# Patient Record
Sex: Female | Born: 1965 | Race: White | Hispanic: No | Smoking: Former smoker
Health system: Southern US, Community
[De-identification: ages and names within clinical notes are randomized; demographics above are authoritative.]

## PROBLEM LIST (undated history)

## (undated) ENCOUNTER — Emergency Department: Disposition: A | Discharge status: Home / Self Care | Payer: No Typology Code available for payment source

## (undated) DIAGNOSIS — F988 Other specified behavioral and emotional disorders with onset usually occurring in childhood and adolescence: Secondary | ICD-10-CM

## (undated) DIAGNOSIS — N189 Chronic kidney disease, unspecified: Secondary | ICD-10-CM

## (undated) DIAGNOSIS — E7800 Pure hypercholesterolemia, unspecified: Secondary | ICD-10-CM

## (undated) DIAGNOSIS — E785 Hyperlipidemia, unspecified: Secondary | ICD-10-CM

## (undated) DIAGNOSIS — O24419 Gestational diabetes mellitus in pregnancy, unspecified control: Secondary | ICD-10-CM

## (undated) DIAGNOSIS — G43909 Migraine, unspecified, not intractable, without status migrainosus: Secondary | ICD-10-CM

## (undated) DIAGNOSIS — E1140 Type 2 diabetes mellitus with diabetic neuropathy, unspecified: Secondary | ICD-10-CM

## (undated) HISTORY — DX: Type 2 diabetes mellitus with diabetic neuropathy, unspecified: E11.40

## (undated) HISTORY — DX: Chronic kidney disease, unspecified: N18.9

## (undated) HISTORY — DX: Migraine, unspecified, not intractable, without status migrainosus: G43.909

## (undated) HISTORY — DX: Other specified behavioral and emotional disorders with onset usually occurring in childhood and adolescence: F98.8

## (undated) HISTORY — DX: Hyperlipidemia, unspecified: E78.5

## (undated) HISTORY — DX: Gestational diabetes mellitus in pregnancy, unspecified control: O24.419

## (undated) HISTORY — DX: Pure hypercholesterolemia, unspecified: E78.00

---

## 1998-03-28 ENCOUNTER — Encounter (HOSPITAL_COMMUNITY): Admission: RE | Admit: 1998-03-28 | Discharge: 1998-06-26 | Payer: Self-pay | Admitting: Dermatology

## 1999-02-25 ENCOUNTER — Emergency Department (HOSPITAL_COMMUNITY): Admission: EM | Admit: 1999-02-25 | Discharge: 1999-02-25 | Payer: Self-pay | Admitting: Emergency Medicine

## 2004-06-12 ENCOUNTER — Emergency Department (HOSPITAL_COMMUNITY): Admission: EM | Admit: 2004-06-12 | Discharge: 2004-06-13 | Payer: Self-pay | Admitting: Emergency Medicine

## 2009-02-22 ENCOUNTER — Emergency Department: Payer: Self-pay | Admitting: Internal Medicine

## 2012-05-17 ENCOUNTER — Encounter: Payer: Self-pay | Admitting: *Deleted

## 2012-05-17 NOTE — Telephone Encounter (Signed)
error 

## 2014-11-05 ENCOUNTER — Other Ambulatory Visit: Payer: Self-pay | Admitting: Family Medicine

## 2014-11-05 ENCOUNTER — Ambulatory Visit
Admission: RE | Admit: 2014-11-05 | Discharge: 2014-11-05 | Disposition: A | Discharge status: Home / Self Care | Payer: No Typology Code available for payment source | Source: Ambulatory Visit | Attending: Family Medicine | Admitting: Family Medicine

## 2014-11-05 DIAGNOSIS — M25512 Pain in left shoulder: Secondary | ICD-10-CM

## 2016-03-11 DIAGNOSIS — J029 Acute pharyngitis, unspecified: Secondary | ICD-10-CM | POA: Diagnosis not present

## 2016-03-11 DIAGNOSIS — J069 Acute upper respiratory infection, unspecified: Secondary | ICD-10-CM | POA: Diagnosis not present

## 2016-03-15 DIAGNOSIS — N189 Chronic kidney disease, unspecified: Secondary | ICD-10-CM | POA: Diagnosis not present

## 2016-03-15 DIAGNOSIS — E1121 Type 2 diabetes mellitus with diabetic nephropathy: Secondary | ICD-10-CM | POA: Diagnosis not present

## 2016-03-15 DIAGNOSIS — F900 Attention-deficit hyperactivity disorder, predominantly inattentive type: Secondary | ICD-10-CM | POA: Diagnosis not present

## 2016-03-15 DIAGNOSIS — Z79899 Other long term (current) drug therapy: Secondary | ICD-10-CM | POA: Diagnosis not present

## 2016-03-17 DIAGNOSIS — F3342 Major depressive disorder, recurrent, in full remission: Secondary | ICD-10-CM | POA: Diagnosis not present

## 2016-03-17 DIAGNOSIS — F900 Attention-deficit hyperactivity disorder, predominantly inattentive type: Secondary | ICD-10-CM | POA: Diagnosis not present

## 2016-03-17 DIAGNOSIS — F410 Panic disorder [episodic paroxysmal anxiety] without agoraphobia: Secondary | ICD-10-CM | POA: Diagnosis not present

## 2016-06-04 DIAGNOSIS — L959 Vasculitis limited to the skin, unspecified: Secondary | ICD-10-CM | POA: Diagnosis not present

## 2016-06-04 DIAGNOSIS — E1165 Type 2 diabetes mellitus with hyperglycemia: Secondary | ICD-10-CM | POA: Diagnosis not present

## 2016-06-04 DIAGNOSIS — F900 Attention-deficit hyperactivity disorder, predominantly inattentive type: Secondary | ICD-10-CM | POA: Diagnosis not present

## 2016-06-04 DIAGNOSIS — Z794 Long term (current) use of insulin: Secondary | ICD-10-CM | POA: Diagnosis not present

## 2016-08-03 DIAGNOSIS — D485 Neoplasm of uncertain behavior of skin: Secondary | ICD-10-CM | POA: Diagnosis not present

## 2016-08-03 DIAGNOSIS — L820 Inflamed seborrheic keratosis: Secondary | ICD-10-CM | POA: Diagnosis not present

## 2016-09-08 DIAGNOSIS — F331 Major depressive disorder, recurrent, moderate: Secondary | ICD-10-CM | POA: Diagnosis not present

## 2016-09-08 DIAGNOSIS — F411 Generalized anxiety disorder: Secondary | ICD-10-CM | POA: Diagnosis not present

## 2016-09-14 DIAGNOSIS — E1121 Type 2 diabetes mellitus with diabetic nephropathy: Secondary | ICD-10-CM | POA: Diagnosis not present

## 2016-09-14 DIAGNOSIS — R21 Rash and other nonspecific skin eruption: Secondary | ICD-10-CM | POA: Diagnosis not present

## 2016-09-14 DIAGNOSIS — Z794 Long term (current) use of insulin: Secondary | ICD-10-CM | POA: Diagnosis not present

## 2016-09-14 DIAGNOSIS — Z79899 Other long term (current) drug therapy: Secondary | ICD-10-CM | POA: Diagnosis not present

## 2016-10-04 DIAGNOSIS — Z6828 Body mass index (BMI) 28.0-28.9, adult: Secondary | ICD-10-CM | POA: Diagnosis not present

## 2016-10-04 DIAGNOSIS — Z01419 Encounter for gynecological examination (general) (routine) without abnormal findings: Secondary | ICD-10-CM | POA: Diagnosis not present

## 2016-10-04 DIAGNOSIS — Z1231 Encounter for screening mammogram for malignant neoplasm of breast: Secondary | ICD-10-CM | POA: Diagnosis not present

## 2016-10-18 DIAGNOSIS — L308 Other specified dermatitis: Secondary | ICD-10-CM | POA: Diagnosis not present

## 2016-10-19 DIAGNOSIS — N870 Mild cervical dysplasia: Secondary | ICD-10-CM | POA: Diagnosis not present

## 2016-10-19 DIAGNOSIS — R87610 Atypical squamous cells of undetermined significance on cytologic smear of cervix (ASC-US): Secondary | ICD-10-CM | POA: Diagnosis not present

## 2016-11-25 DIAGNOSIS — F331 Major depressive disorder, recurrent, moderate: Secondary | ICD-10-CM | POA: Diagnosis not present

## 2016-12-06 DIAGNOSIS — E119 Type 2 diabetes mellitus without complications: Secondary | ICD-10-CM | POA: Diagnosis not present

## 2016-12-06 DIAGNOSIS — H5213 Myopia, bilateral: Secondary | ICD-10-CM | POA: Diagnosis not present

## 2016-12-06 DIAGNOSIS — Z0100 Encounter for examination of eyes and vision without abnormal findings: Secondary | ICD-10-CM | POA: Diagnosis not present

## 2016-12-09 ENCOUNTER — Other Ambulatory Visit (INDEPENDENT_AMBULATORY_CARE_PROVIDER_SITE_OTHER): Payer: Self-pay | Admitting: Orthopedic Surgery

## 2016-12-09 NOTE — Telephone Encounter (Signed)
Rx request 

## 2017-01-25 DIAGNOSIS — J329 Chronic sinusitis, unspecified: Secondary | ICD-10-CM | POA: Diagnosis not present

## 2017-01-25 DIAGNOSIS — E1165 Type 2 diabetes mellitus with hyperglycemia: Secondary | ICD-10-CM | POA: Diagnosis not present

## 2017-01-25 DIAGNOSIS — F411 Generalized anxiety disorder: Secondary | ICD-10-CM | POA: Diagnosis not present

## 2017-01-25 DIAGNOSIS — F900 Attention-deficit hyperactivity disorder, predominantly inattentive type: Secondary | ICD-10-CM | POA: Diagnosis not present

## 2017-01-25 DIAGNOSIS — F3342 Major depressive disorder, recurrent, in full remission: Secondary | ICD-10-CM | POA: Diagnosis not present

## 2017-01-25 DIAGNOSIS — E1121 Type 2 diabetes mellitus with diabetic nephropathy: Secondary | ICD-10-CM | POA: Diagnosis not present

## 2017-04-04 DIAGNOSIS — L308 Other specified dermatitis: Secondary | ICD-10-CM | POA: Diagnosis not present

## 2017-04-04 DIAGNOSIS — B360 Pityriasis versicolor: Secondary | ICD-10-CM | POA: Diagnosis not present

## 2017-04-06 ENCOUNTER — Encounter: Payer: Self-pay | Admitting: Neurology

## 2017-05-10 DIAGNOSIS — R87612 Low grade squamous intraepithelial lesion on cytologic smear of cervix (LGSIL): Secondary | ICD-10-CM | POA: Diagnosis not present

## 2017-05-10 DIAGNOSIS — N870 Mild cervical dysplasia: Secondary | ICD-10-CM | POA: Diagnosis not present

## 2017-07-11 ENCOUNTER — Encounter: Payer: Self-pay | Admitting: Neurology

## 2017-07-11 ENCOUNTER — Ambulatory Visit (INDEPENDENT_AMBULATORY_CARE_PROVIDER_SITE_OTHER): Payer: BLUE CROSS/BLUE SHIELD | Admitting: Neurology

## 2017-07-11 VITALS — BP 110/70 | HR 88 | Ht 64.0 in | Wt 172.1 lb

## 2017-07-11 DIAGNOSIS — M79671 Pain in right foot: Secondary | ICD-10-CM

## 2017-07-11 DIAGNOSIS — R202 Paresthesia of skin: Secondary | ICD-10-CM | POA: Diagnosis not present

## 2017-07-11 DIAGNOSIS — M779 Enthesopathy, unspecified: Secondary | ICD-10-CM | POA: Diagnosis not present

## 2017-07-11 DIAGNOSIS — M79672 Pain in left foot: Secondary | ICD-10-CM

## 2017-07-11 NOTE — Progress Notes (Signed)
[facility] Clinic Note - Initial Visit   Date: 07/11/17  Sonna Lipsky MRN: [id number] DOB: 1966-09-02   Dear Dr. Stephanie Acre:  Thank you for your kind referral of Dwan Fennel for consultation of bilateral feet pain. Although her history is well known to you, please allow [place] to reiterate it for the purpose of our medical record. The patient was accompanied to the clinic by self.   History of Present Illness: Ivett Luebbe is a 51 y.o. right-handed Caucasian female with diabetes mellitus (HbA1c 5.6), hypertension, ADD, CKD, and hyperlipidemia presenting for evaluation of feet numbness.  Starting around early summer 2018, she started having numbness of the ball of her feet, which is worse in the left foot.  Sometimes she has sharp sensation over the sole of the foot, which is present only when standing, such as when getting up in the morning.   Numbness is constant and improved with wearing shoes.  If she sits for a long time on a chair, her feet often fall asleep, but this quickly resolves with standing.  She denies any tingling, weakness, imbalance or falls. She also complains of left posterior ankle achiness.  She has tried ibuprofen (no improvement), neurontin (intolerance), and amitriptyline (intolerance). Pain is ranked as 5/10.  She denies any back pain or similar sensation in the hands.   Past Medical History:  Diagnosis Date  . ADD (attention deficit disorder)   . CKD (chronic kidney disease)   . Diabetic neuropathy ([place])   . Gestational diabetes   . Hypercholesterolemia   . Hyperlipidemia   . Migraine     History reviewed. No pertinent surgical history.   Medications:  Outpatient Encounter Prescriptions as of 07/11/2017  Medication Sig  . BD PEN NEEDLE NANO U/F 32G X 4 MM MISC USE AS DIRECTED ONCE DAILY WITH LEVEMIR AND 3 TIMES DAILY FOR HUMALOG  . Blood Glucose Monitoring Suppl (CONTOUR NEXT MONITOR) w/Device KIT USE TO TEST BLOOD SUGAR  BID  . CONTOUR NEXT TEST test strip   . diclofenac sodium (VOLTAREN) 1 % GEL APPLY 4 GRAMS TO THE AFFECTED AREA TWICE DAILY  . estradiol (VIVELLE-DOT) 0.05 MG/24HR patch USE UTD  . Insulin NPH Human, Isophane, (HUMULIN N ) Inject 12 Units into the skin at bedtime.  Marland Kitchen LEVEMIR FLEXTOUCH 100 UNIT/ML Pen   . simvastatin (ZOCOR) 20 MG tablet TK 1 T PO QPM  . VICTOZA 18 MG/3ML SOPN INJECT 1.2 MG SQ D   No facility-administered encounter medications on file as of 07/11/2017.      Allergies:  Allergies  Allergen Reactions  . Codeine     Family History: Family History  Problem Relation Age of Onset  . Heart attack Mother   . Parkinson's disease Mother   . CAD Mother   . Alzheimer's disease Father   . Diabetes Father   . Hypertension Father     Social History: Social History  Substance Use Topics  . Smoking status: Former Research scientist (life sciences)  . Smokeless tobacco: Never Used  . Alcohol use No   Social History   Social History Narrative   Patient lives with husband in a 2 story home.  Has 3 children.  Works as a Materials engineer.  Education: college degree.     Review of Systems:  CONSTITUTIONAL: No fevers, chills, night sweats, or weight loss.   EYES: No visual changes or eye pain ENT: No hearing changes.  No history of nose bleeds.   RESPIRATORY: No cough, wheezing and shortness of  breath.   CARDIOVASCULAR: Negative for chest pain, and palpitations.   GI: Negative for abdominal discomfort, blood in stools or black stools.  No recent change in bowel habits.   GU:  No history of incontinence.   MUSCLOSKELETAL: No history of joint pain or swelling.  No myalgias.   SKIN: Negative for lesions, rash, and itching.   HEMATOLOGY/ONCOLOGY: Negative for prolonged bleeding, bruising easily, and swollen nodes.  No history of cancer.   ENDOCRINE: Negative for cold or heat intolerance, polydipsia or goiter.   PSYCH:  No depression or anxiety symptoms.   NEURO: As Above.   Vital Signs:  BP 110/70    Pulse 88   Ht _0  (1.626 m)   Wt 172 lb 1 oz (78 kg)   SpO2 97%   BMI 29.53 kg/m    General Medical Exam:   General:  Well appearing, comfortable.   Eyes/ENT: see cranial nerve examination.   Neck: No masses appreciated.  Full range of motion without tenderness.  No carotid bruits. Respiratory:  Clear to auscultation, good air entry bilaterally.   Cardiac:  Regular rate and rhythm, no murmur.   Extremities:  No deformities, edema, or skin discoloration.  Skin:  No rashes or lesions.  Neurological Exam: MENTAL STATUS including orientation to time, place, person, recent and remote memory, attention span and concentration, language, and fund of knowledge is normal.  Speech is not dysarthric.  CRANIAL NERVES: II:  No visual field defects.  Unremarkable fundi.   III-IV-VI: Pupils equal round and reactive to light.  Normal conjugate, extra-ocular eye movements in all directions of gaze.  No nystagmus.  No ptosis.   V:  Normal facial sensation.   VII:  Normal facial symmetry and movements.  VIII:  Normal hearing and vestibular function.   IX-X:  Normal palatal movement.   XI:  Normal shoulder shrug and head rotation.   XII:  Normal tongue strength and range of motion, no deviation or fasciculation.  MOTOR:  Motor strength is 5/5 throughout, including in the feet. No atrophy, fasciculations or abnormal movements.  No pronator drift.  Tone is normal.    MSRs:  Right                                                                 Left brachioradialis 2+  brachioradialis 2+  biceps 2+  biceps 2+  triceps 2+  triceps 2+  patellar 2+  patellar 2+  ankle jerk 2+  ankle jerk 2+  Hoffman no  Hoffman no  plantar response down  plantar response down   SENSORY:  Normal and symmetric perception of light touch, pinprick, vibration, and proprioception.  Romberg's sign absent.   COORDINATION/GAIT: Normal finger-to- nose-finger.  Intact rapid alternating movements bilaterally.  Able to rise from  a chair without using arms.  Gait narrow based and stable. She is able to perform stressed gait, but heel walking was stopped due to reproduction of pain in her heel.  IMPRESSION: Mrs. Heward is a 51 year-old diabetic female referred for evaluation of left > right feet pain.  Her neurological exam shows normal reflexes, sensation, and strength of the feet. She has pain with weight bearing on the left mid-foot and especially with heel walking. These features may suggest  more of a musculoskeletal type pain pain, ?tendinitis vs plantar fasciitis.  I offered to evaluate her symptoms with NCS/EMG of the legs, but she would like to think about this.  In the meantime, she was recommended to use NSAIDs, tylenol, ice, and roll a tennis ball to her feet to see if this helps.  If no improvement, return for EDX.  I reassured her that I did not see any evidence of neuropathy, but with sensation of numbness, she may certainly have mild and clinically undetectable neuropathy, and she should continue to keep her sugars under control to minimize progression.  She was informed that medication are not effective in treating numbness.    Thank you for allowing me to participate in patient's care.  If I can answer any additional questions, I would be pleased to do so.    Sincerely,    Fidencia Mccloud K. Posey Pronto, DO

## 2017-07-11 NOTE — Patient Instructions (Signed)
Your feet pain does not seem to be primary nerve related problem.  You may have tendonitis involving the sole of her feet and you can try using ibuprofen, Aleve, or Tylenol for pain relief.    If your symptoms do not improve, please call to schedule nerve testing

## 2017-07-19 DIAGNOSIS — F3342 Major depressive disorder, recurrent, in full remission: Secondary | ICD-10-CM | POA: Diagnosis not present

## 2017-07-19 DIAGNOSIS — F900 Attention-deficit hyperactivity disorder, predominantly inattentive type: Secondary | ICD-10-CM | POA: Diagnosis not present

## 2017-07-19 DIAGNOSIS — F411 Generalized anxiety disorder: Secondary | ICD-10-CM | POA: Diagnosis not present

## 2017-09-15 DIAGNOSIS — Z79899 Other long term (current) drug therapy: Secondary | ICD-10-CM | POA: Diagnosis not present

## 2017-09-15 DIAGNOSIS — E1121 Type 2 diabetes mellitus with diabetic nephropathy: Secondary | ICD-10-CM | POA: Diagnosis not present

## 2017-09-15 DIAGNOSIS — Z1211 Encounter for screening for malignant neoplasm of colon: Secondary | ICD-10-CM | POA: Diagnosis not present

## 2017-09-15 DIAGNOSIS — Z23 Encounter for immunization: Secondary | ICD-10-CM | POA: Diagnosis not present

## 2017-11-08 DIAGNOSIS — Z01419 Encounter for gynecological examination (general) (routine) without abnormal findings: Secondary | ICD-10-CM | POA: Diagnosis not present

## 2017-11-08 DIAGNOSIS — Z1231 Encounter for screening mammogram for malignant neoplasm of breast: Secondary | ICD-10-CM | POA: Diagnosis not present

## 2017-11-08 DIAGNOSIS — Z6827 Body mass index (BMI) 27.0-27.9, adult: Secondary | ICD-10-CM | POA: Diagnosis not present

## 2017-11-08 DIAGNOSIS — R87610 Atypical squamous cells of undetermined significance on cytologic smear of cervix (ASC-US): Secondary | ICD-10-CM | POA: Diagnosis not present

## 2017-11-09 ENCOUNTER — Ambulatory Visit (INDEPENDENT_AMBULATORY_CARE_PROVIDER_SITE_OTHER): Payer: Self-pay

## 2017-11-09 ENCOUNTER — Ambulatory Visit (INDEPENDENT_AMBULATORY_CARE_PROVIDER_SITE_OTHER): Payer: BLUE CROSS/BLUE SHIELD | Admitting: Orthopedic Surgery

## 2017-11-09 ENCOUNTER — Encounter (INDEPENDENT_AMBULATORY_CARE_PROVIDER_SITE_OTHER): Payer: Self-pay | Admitting: Orthopedic Surgery

## 2017-11-09 DIAGNOSIS — M25561 Pain in right knee: Secondary | ICD-10-CM

## 2017-11-11 ENCOUNTER — Encounter (INDEPENDENT_AMBULATORY_CARE_PROVIDER_SITE_OTHER): Payer: Self-pay | Admitting: Orthopedic Surgery

## 2017-11-11 NOTE — Progress Notes (Signed)
Office Visit Note   Patient: Margaret Higgins           Date of Birth: 10-23-1966           MRN: [id number] Visit Date: 11/09/2017 Requested by: Mila PalmerWolters, Sharon, MD [address] [place], [place] [address] PCP: Mila PalmerWolters, Sharon, MD  Subjective: Chief Complaint  Patient presents with  . Right Knee - Pain    HPI: Margaret Higgins is a patient with right knee pain.  She fell up the steps 10/18/2017.  Been very painful since that time.  She states she has constant aching in the knee along with popping weakness giving way and discrete locking.  She has fallen twice due to giving way episodes.  She has taken some over-the-counter medication for pain but has not been helpful.  No prior surgery on the right knee.  She reports pain primarily with twisting maneuvers.  The pain localizes to the lateral aspect of the knee.              ROS: All systems reviewed are negative as they relate to the chief complaint within the history of present illness.  Patient denies  fevers or chills.   Assessment & Plan: Visit Diagnoses:  1. Acute pain of right knee     Plan: Impression is right knee lateral sided pain with possible meniscal pathology on that lateral side.  Radiographs show general maintenance of joint space with only mild narrowing.  Collateral and cruciate ligaments feel stable.  Need MRI right knee to evaluate lateral meniscal tear.  Follow-up after that study.  Follow-Up Instructions: Return for after MRI.   Orders:  Orders Placed This Encounter  Procedures  . XR KNEE 3 VIEW RIGHT  . MR Knee Right w/o contrast   No orders of the defined types were placed in this encounter.     Procedures: No procedures performed   Clinical Data: No additional findings.  Objective: Vital Signs: There were no vitals taken for this visit.  Physical Exam:   Constitutional: Patient appears well-developed HEENT:  Head: Normocephalic Eyes:EOM are normal Neck: Normal range of  motion Cardiovascular: Normal rate Pulmonary/chest: Effort normal Neurologic: Patient is alert Skin: Skin is warm Psychiatric: Patient has normal mood and affect    Ortho Exam: Orthopedic exam demonstrates full active and passive range of motion of the right knee with positive McMurray testing lateral compartment.  Extensor mechanism is intact.  Pedal pulses palpable.  Collateral and cruciate ligaments stable in the right knee.  No increased Q angle present.  No other masses lymph adenopathy or skin changes noted in the right knee region.  Specialty Comments:  No specialty comments available.  Imaging: No results found.   PMFS History: There are no active problems to display for this patient.  Past Medical History:  Diagnosis Date  . ADD (attention deficit disorder)   . CKD (chronic kidney disease)   . Diabetic neuropathy ([facility])   . Gestational diabetes   . Hypercholesterolemia   . Hyperlipidemia   . Migraine     Family History  Problem Relation Age of Onset  . Heart attack Mother   . Parkinson's disease Mother   . CAD Mother   . Alzheimer's disease Father   . Diabetes Father   . Hypertension Father     History reviewed. No pertinent surgical history. Social History   Occupational History  . Occupation: home maker  Tobacco Use  . Smoking status: Former Games developermoker  . Smokeless tobacco:  Never Used  Substance and Sexual Activity  . Alcohol use: No  . Drug use: No  . Sexual activity: Not on file

## 2017-11-28 ENCOUNTER — Ambulatory Visit
Admission: RE | Admit: 2017-11-28 | Discharge: 2017-11-28 | Disposition: A | Discharge status: Home / Self Care | Payer: BLUE CROSS/BLUE SHIELD | Source: Ambulatory Visit | Attending: Orthopedic Surgery | Admitting: Orthopedic Surgery

## 2017-11-28 DIAGNOSIS — M25561 Pain in right knee: Secondary | ICD-10-CM

## 2017-11-28 DIAGNOSIS — M23222 Derangement of posterior horn of medial meniscus due to old tear or injury, left knee: Secondary | ICD-10-CM | POA: Diagnosis not present

## 2017-12-05 ENCOUNTER — Ambulatory Visit (INDEPENDENT_AMBULATORY_CARE_PROVIDER_SITE_OTHER): Payer: BLUE CROSS/BLUE SHIELD | Admitting: Orthopedic Surgery

## 2017-12-05 ENCOUNTER — Encounter (INDEPENDENT_AMBULATORY_CARE_PROVIDER_SITE_OTHER): Payer: Self-pay | Admitting: Orthopedic Surgery

## 2017-12-05 DIAGNOSIS — M1711 Unilateral primary osteoarthritis, right knee: Secondary | ICD-10-CM | POA: Diagnosis not present

## 2017-12-06 DIAGNOSIS — E119 Type 2 diabetes mellitus without complications: Secondary | ICD-10-CM | POA: Diagnosis not present

## 2017-12-06 DIAGNOSIS — H5213 Myopia, bilateral: Secondary | ICD-10-CM | POA: Diagnosis not present

## 2017-12-10 ENCOUNTER — Encounter (INDEPENDENT_AMBULATORY_CARE_PROVIDER_SITE_OTHER): Payer: Self-pay | Admitting: Orthopedic Surgery

## 2017-12-10 MED ORDER — BUPIVACAINE HCL 0.25 % IJ SOLN
4.0000 mL | INTRAMUSCULAR | Status: AC | PRN
  Administered 2017-12-10: 4 mL via INTRA_ARTICULAR

## 2017-12-10 MED ORDER — LIDOCAINE HCL 1 % IJ SOLN
5.0000 mL | INTRAMUSCULAR | Status: AC | PRN
Start: 2017-12-10 — End: 2017-12-10
  Administered 2017-12-10: 5 mL

## 2017-12-10 MED ORDER — METHYLPREDNISOLONE ACETATE 40 MG/ML IJ SUSP
40.0000 mg | INTRAMUSCULAR | Status: AC | PRN
  Administered 2017-12-10: 40 mg via INTRA_ARTICULAR

## 2017-12-10 NOTE — Progress Notes (Signed)
Office Visit Note   Patient: Margaret Higgins           Date of Birth: [birth year]           MRN: [id number] Visit Date: 12/05/2017 Requested by: Mila PalmerWolters, Sharon, MD [address] [place], [place] [address] PCP: Mila PalmerWolters, Sharon, MD  Subjective: Chief Complaint  Patient presents with  . Right Knee - Follow-up    HPI: Margaret Higgins is a patient with right knee pain.  Since I have seen her she has had an MRI scan which shows lateral compartment arthritis along with a lateral meniscal tear.  She reports definite mechanical symptoms in the right knee.  She also reports both medial and lateral sided pain.  She fell on December 18.  Is already taking Mobic daily.              ROS: All systems reviewed are negative as they relate to the chief complaint within the history of present illness.  Patient denies  fevers or chills.   Assessment & Plan: Visit Diagnoses:  1. Unilateral primary osteoarthritis, right knee     Plan: Impression is mild lateral compartment arthritis with lateral meniscal tear.  We will try an injection into the knee today.  6-week return to decide for or against surgical intervention in the form of arthroscopy and debridement  Follow-Up Instructions: Return in about 6 weeks (around 01/16/2018).   Orders:  No orders of the defined types were placed in this encounter.  No orders of the defined types were placed in this encounter.     Procedures: Large Joint Inj: R knee on 12/10/2017 4:31 PM Indications: diagnostic evaluation, joint swelling and pain Details: 18 G 1.5 in needle, superolateral approach  Arthrogram: No  Medications: 5 mL lidocaine 1 %; 40 mg methylPREDNISolone acetate 40 MG/ML; 4 mL bupivacaine 0.25 % Aspirate: 30 mL yellow Outcome: tolerated well, no immediate complications Procedure, treatment alternatives, risks and benefits explained, specific risks discussed. Consent was given by the patient. Immediately prior to procedure a time out  was called to verify the correct patient, procedure, equipment, support staff and site/side marked as required. Patient was prepped and draped in the usual sterile fashion.       Clinical Data: No additional findings.  Objective: Vital Signs: There were no vitals taken for this visit.  Physical Exam:   Constitutional: Patient appears well-developed HEENT:  Head: Normocephalic Eyes:EOM are normal Neck: Normal range of motion Cardiovascular: Normal rate Pulmonary/chest: Effort normal Neurologic: Patient is alert Skin: Skin is warm Psychiatric: Patient has normal mood and affect    Ortho Exam: Orthopedic exam demonstrates lateral and medial joint line tenderness with mild effusion stable collateral cruciate ligaments.  Extensor mechanism is intact.  No masses lymphadenopathy or skin changes noted in the right knee region.  No groin pain with internal/external rotation of the leg.  Specialty Comments:  No specialty comments available.  Imaging: No results found.   PMFS History: There are no active problems to display for this patient.  Past Medical History:  Diagnosis Date  . ADD (attention deficit disorder)   . CKD (chronic kidney disease)   . Diabetic neuropathy ([facility])   . Gestational diabetes   . Hypercholesterolemia   . Hyperlipidemia   . Migraine     Family History  Problem Relation Age of Onset  . Heart attack Mother   . Parkinson's disease Mother   . CAD Mother   . Alzheimer's disease Father   .  Diabetes Father   . Hypertension Father     History reviewed. No pertinent surgical history. Social History   Occupational History  . Occupation: home maker  Tobacco Use  . Smoking status: Former Games developer  . Smokeless tobacco: Never Used  Substance and Sexual Activity  . Alcohol use: No  . Drug use: No  . Sexual activity: Not on file

## 2017-12-17 ENCOUNTER — Other Ambulatory Visit (INDEPENDENT_AMBULATORY_CARE_PROVIDER_SITE_OTHER): Payer: Self-pay | Admitting: Orthopedic Surgery

## 2017-12-19 NOTE — Telephone Encounter (Signed)
Ok to rf? 

## 2017-12-19 NOTE — Telephone Encounter (Signed)
y

## 2018-01-10 DIAGNOSIS — F331 Major depressive disorder, recurrent, moderate: Secondary | ICD-10-CM | POA: Diagnosis not present

## 2018-01-16 ENCOUNTER — Ambulatory Visit (INDEPENDENT_AMBULATORY_CARE_PROVIDER_SITE_OTHER): Payer: BLUE CROSS/BLUE SHIELD | Admitting: Orthopedic Surgery

## 2018-01-17 ENCOUNTER — Other Ambulatory Visit (INDEPENDENT_AMBULATORY_CARE_PROVIDER_SITE_OTHER): Payer: Self-pay | Admitting: Orthopedic Surgery

## 2018-01-17 NOTE — Telephone Encounter (Signed)
y

## 2018-01-17 NOTE — Telephone Encounter (Signed)
Ok to rf? 

## 2018-02-02 ENCOUNTER — Ambulatory Visit (INDEPENDENT_AMBULATORY_CARE_PROVIDER_SITE_OTHER): Payer: BLUE CROSS/BLUE SHIELD | Admitting: Orthopedic Surgery

## 2018-02-08 DIAGNOSIS — R52 Pain, unspecified: Secondary | ICD-10-CM | POA: Diagnosis not present

## 2018-02-08 DIAGNOSIS — R05 Cough: Secondary | ICD-10-CM | POA: Diagnosis not present

## 2018-02-08 DIAGNOSIS — Z1211 Encounter for screening for malignant neoplasm of colon: Secondary | ICD-10-CM | POA: Diagnosis not present

## 2018-03-14 DIAGNOSIS — F900 Attention-deficit hyperactivity disorder, predominantly inattentive type: Secondary | ICD-10-CM | POA: Diagnosis not present

## 2018-03-14 DIAGNOSIS — F3342 Major depressive disorder, recurrent, in full remission: Secondary | ICD-10-CM | POA: Diagnosis not present

## 2018-06-19 ENCOUNTER — Encounter (INDEPENDENT_AMBULATORY_CARE_PROVIDER_SITE_OTHER): Payer: Self-pay | Admitting: Orthopedic Surgery

## 2018-06-19 ENCOUNTER — Ambulatory Visit (INDEPENDENT_AMBULATORY_CARE_PROVIDER_SITE_OTHER): Payer: BLUE CROSS/BLUE SHIELD | Admitting: Orthopedic Surgery

## 2018-06-19 DIAGNOSIS — M1711 Unilateral primary osteoarthritis, right knee: Secondary | ICD-10-CM | POA: Diagnosis not present

## 2018-06-20 MED ORDER — BUPIVACAINE HCL 0.25 % IJ SOLN
4.0000 mL | INTRAMUSCULAR | Status: AC | PRN
  Administered 2018-06-20: 4 mL via INTRA_ARTICULAR

## 2018-06-20 MED ORDER — LIDOCAINE HCL 1 % IJ SOLN
5.0000 mL | INTRAMUSCULAR | Status: AC | PRN
  Administered 2018-06-20: 5 mL

## 2018-06-20 MED ORDER — METHYLPREDNISOLONE ACETATE 40 MG/ML IJ SUSP
40.0000 mg | INTRAMUSCULAR | Status: AC | PRN
  Administered 2018-06-20: 40 mg via INTRA_ARTICULAR

## 2018-06-20 NOTE — Progress Notes (Signed)
Office Visit Note   Patient: Margaret Higgins           Date of Birth: [birth year]           MRN: [id number] Visit Date: 06/19/2018 Requested by: Mila PalmerWolters, Sharon, MD [address] [place], [place] [address] PCP: Mila PalmerWolters, Sharon, MD  Subjective: Chief Complaint  Patient presents with  . Right Knee - Pain    HPI: Patient presents for evaluation of right knee pain.  She has known history of arthritis and meniscal pathology in the right knee.  She had an injection and aspiration in February and did well with that.  Denies any recurrent mechanical symptoms but is having some recurrence of pain.  She has been taking anti-inflammatories.  She does not really want to consider arthroscopic intervention yet.  She would like to have another injection performed.              ROS: All systems reviewed are negative as they relate to the chief complaint within the history of present illness.  Patient denies  fevers or chills.   Assessment & Plan: Visit Diagnoses:  1. Unilateral primary osteoarthritis, right knee     Plan: Impression is right knee primarily lateral compartment arthritis but with global symptoms.  Plan is injection today into the knee.  No significant effusion present.  We will consider evaluation in the future for surgical intervention but for now we will keep on with non-loadbearing quad strengthening exercises and injections. Follow-Up Instructions: No follow-ups on file.   Orders:  No orders of the defined types were placed in this encounter.  No orders of the defined types were placed in this encounter.     Procedures: Large Joint Inj: R knee on 06/20/2018 9:16 AM Indications: diagnostic evaluation, joint swelling and pain Details: 18 G 1.5 in needle, superolateral approach  Arthrogram: No  Medications: 5 mL lidocaine 1 %; 40 mg methylPREDNISolone acetate 40 MG/ML; 4 mL bupivacaine 0.25 % Outcome: tolerated well, no immediate complications Procedure,  treatment alternatives, risks and benefits explained, specific risks discussed. Consent was given by the patient. Immediately prior to procedure a time out was called to verify the correct patient, procedure, equipment, support staff and site/side marked as required. Patient was prepped and draped in the usual sterile fashion.       Clinical Data: No additional findings.  Objective: Vital Signs: There were no vitals taken for this visit.  Physical Exam:   Constitutional: Patient appears well-developed HEENT:  Head: Normocephalic Eyes:EOM are normal Neck: Normal range of motion Cardiovascular: Normal rate Pulmonary/chest: Effort normal Neurologic: Patient is alert Skin: Skin is warm Psychiatric: Patient has normal mood and affect    Ortho Exam: Ortho exam demonstrates a normal gait alignment with palpable pedal pulses.  Knee range of motion intact.  There is lateral greater than medial joint line tenderness.  Intact extensor mechanism no other masses lymphadenopathy or skin changes noted in that right knee region.  Collateral and cruciate ligaments are stable.  Specialty Comments:  No specialty comments available.  Imaging: No results found.   PMFS History: There are no active problems to display for this patient.  Past Medical History:  Diagnosis Date  . ADD (attention deficit disorder)   . CKD (chronic kidney disease)   . Diabetic neuropathy ([facility])   . Gestational diabetes   . Hypercholesterolemia   . Hyperlipidemia   . Migraine     Family History  Problem Relation Age of Onset  .  Heart attack Mother   . Parkinson's disease Mother   . CAD Mother   . Alzheimer's disease Father   . Diabetes Father   . Hypertension Father     History reviewed. No pertinent surgical history. Social History   Occupational History  . Occupation: home maker  Tobacco Use  . Smoking status: Former Games developermoker  . Smokeless tobacco: Never Used  Substance and Sexual Activity  .  Alcohol use: No  . Drug use: No  . Sexual activity: Not on file

## 2018-06-21 DIAGNOSIS — Z87410 Personal history of cervical dysplasia: Secondary | ICD-10-CM | POA: Diagnosis not present

## 2018-06-21 DIAGNOSIS — Z0142 Encounter for cervical smear to confirm findings of recent normal smear following initial abnormal smear: Secondary | ICD-10-CM | POA: Diagnosis not present

## 2018-06-21 DIAGNOSIS — N870 Mild cervical dysplasia: Secondary | ICD-10-CM | POA: Diagnosis not present

## 2018-09-05 DIAGNOSIS — F3342 Major depressive disorder, recurrent, in full remission: Secondary | ICD-10-CM | POA: Diagnosis not present

## 2018-09-05 DIAGNOSIS — F411 Generalized anxiety disorder: Secondary | ICD-10-CM | POA: Diagnosis not present

## 2018-09-05 DIAGNOSIS — F900 Attention-deficit hyperactivity disorder, predominantly inattentive type: Secondary | ICD-10-CM | POA: Diagnosis not present

## 2018-09-15 DIAGNOSIS — E1121 Type 2 diabetes mellitus with diabetic nephropathy: Secondary | ICD-10-CM | POA: Diagnosis not present

## 2018-09-15 DIAGNOSIS — Z79899 Other long term (current) drug therapy: Secondary | ICD-10-CM | POA: Diagnosis not present

## 2018-09-15 DIAGNOSIS — F411 Generalized anxiety disorder: Secondary | ICD-10-CM | POA: Diagnosis not present

## 2018-09-15 DIAGNOSIS — N189 Chronic kidney disease, unspecified: Secondary | ICD-10-CM | POA: Diagnosis not present

## 2018-09-15 DIAGNOSIS — E782 Mixed hyperlipidemia: Secondary | ICD-10-CM | POA: Diagnosis not present

## 2018-12-04 DIAGNOSIS — S0993XA Unspecified injury of face, initial encounter: Secondary | ICD-10-CM | POA: Diagnosis not present

## 2018-12-04 DIAGNOSIS — R6889 Other general symptoms and signs: Secondary | ICD-10-CM | POA: Diagnosis not present

## 2018-12-12 DIAGNOSIS — E119 Type 2 diabetes mellitus without complications: Secondary | ICD-10-CM | POA: Diagnosis not present

## 2018-12-12 DIAGNOSIS — H5213 Myopia, bilateral: Secondary | ICD-10-CM | POA: Diagnosis not present

## 2019-02-23 IMAGING — MR MR KNEE*R* W/O CM
4 of 5 series · 22 of 40 positions shown · non-contrast
Comparison: None.

CLINICAL DATA: Right knee pain for 1 month.  Status post fall.

EXAM:
MRI OF THE RIGHT KNEE WITHOUT CONTRAST
TECHNIQUE: Multiplanar, multisequence MR imaging of the knee was performed. No
intravenous contrast was administered.

[Series 3: PD fat-sat · axial · 4.0mm · 0.42mm/px · z∈[-63,+38]mm · 7 of 22 slices shown (1 of 3)]
[im 1/22]
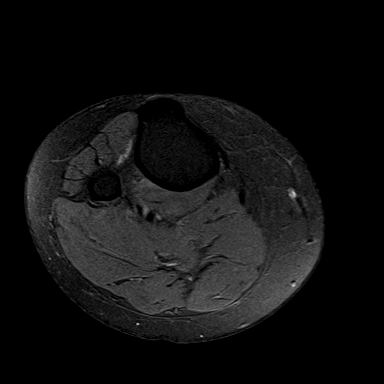
[im 4/22]
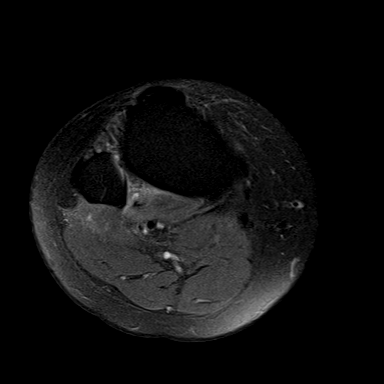
[im 8/22]
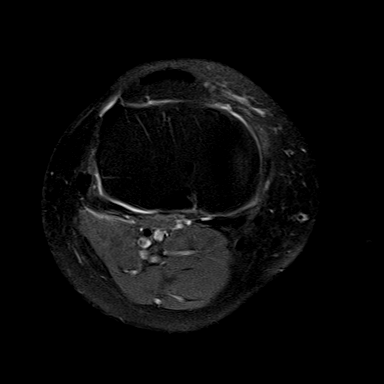
[im 11/22]
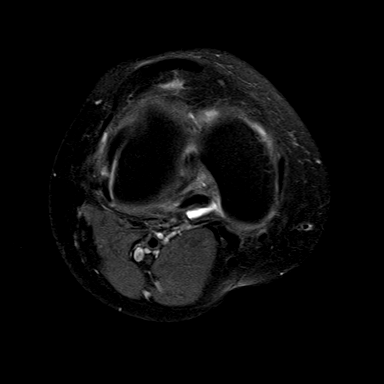
[im 15/22]
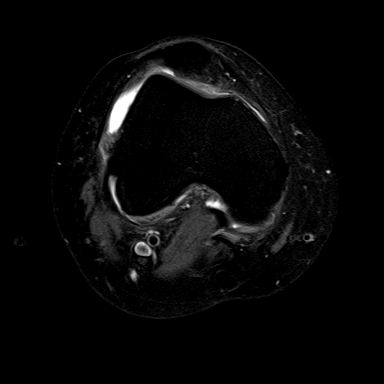
[im 18/22]
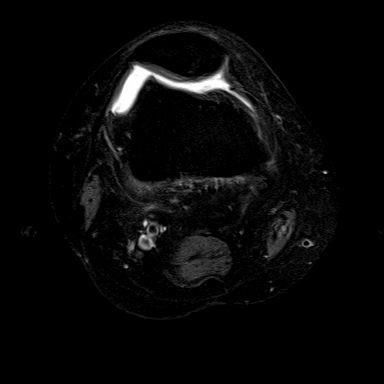
[im 22/22]
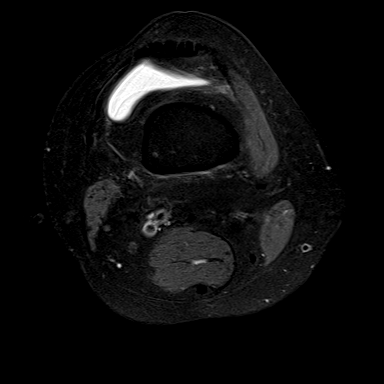

[Series 4: PD fat-sat · sagittal · 4.0mm · 0.31mm/px · 6 of 20 slices shown (2 of 3)]
[im 1/20]
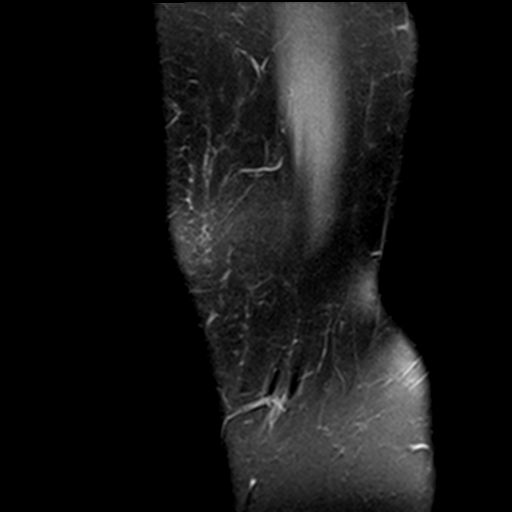
[im 4/20]
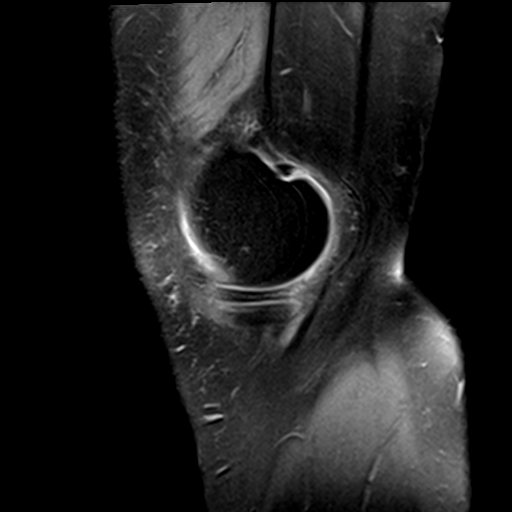
[im 8/20]
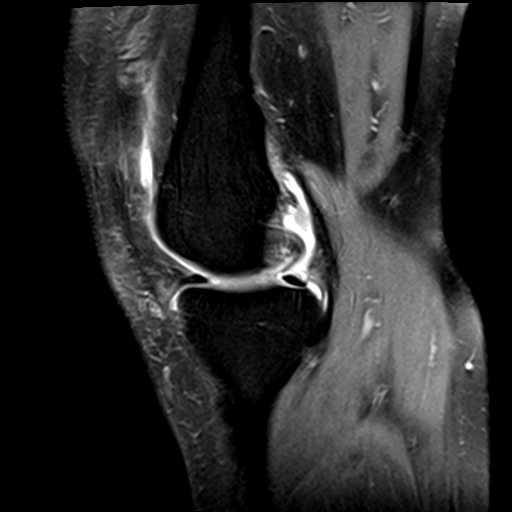
[im 12/20]
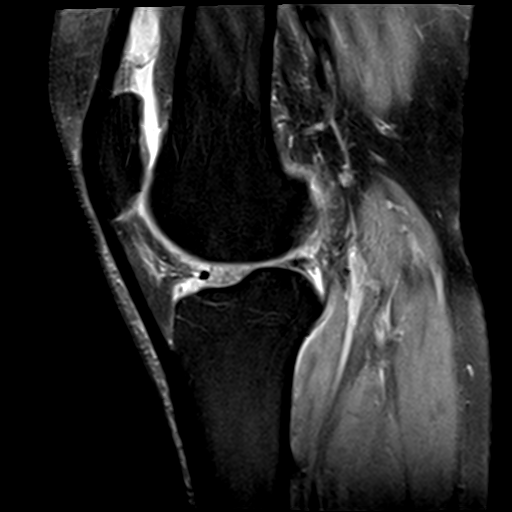
[im 16/20]
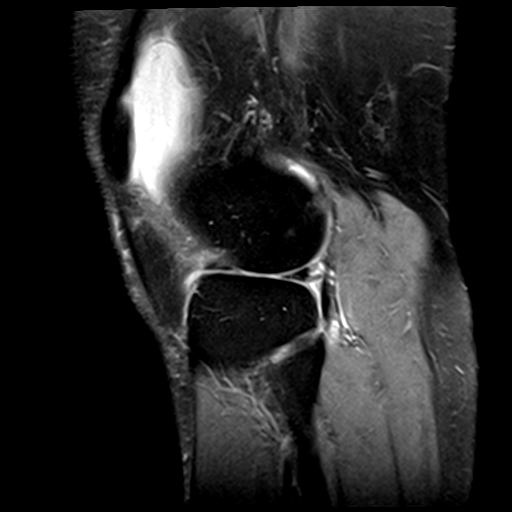
[im 20/20]
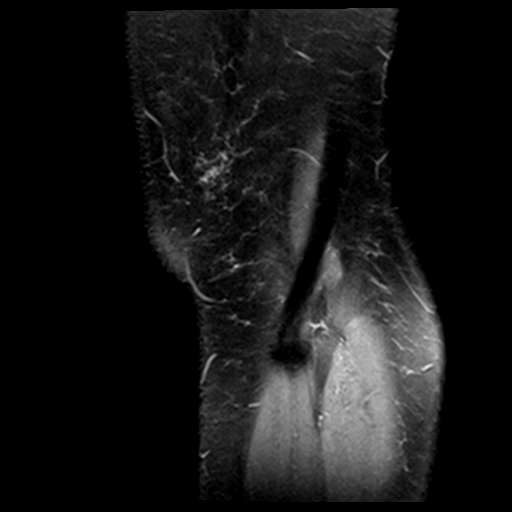

[Series 6: T2 fat-sat · coronal · 3.0mm · 0.29mm/px · 3 of 25 slices shown]
[im 4/25]
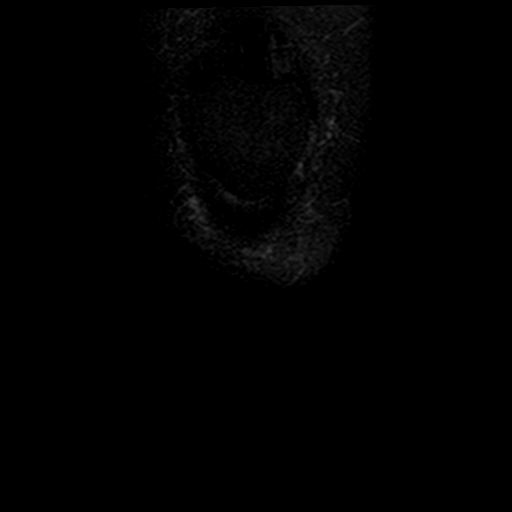
[im 13/25]
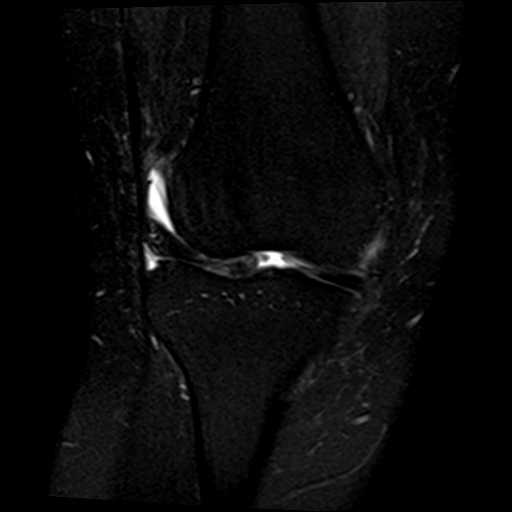
[im 22/25]
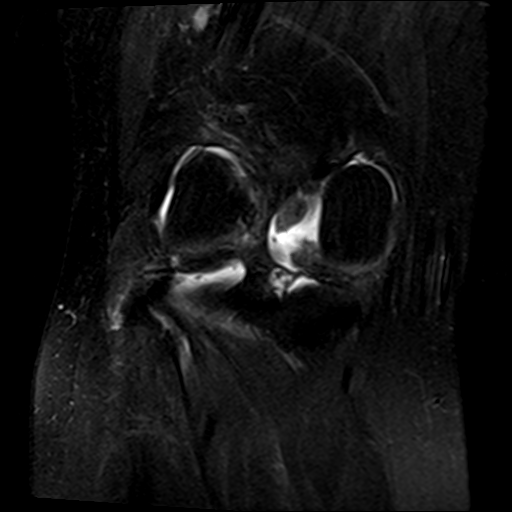

[Series 7: PD fat-sat · coronal · 3.0mm · 0.29mm/px · 6 of 25 slices shown (3 of 3)]
[im 1/25]
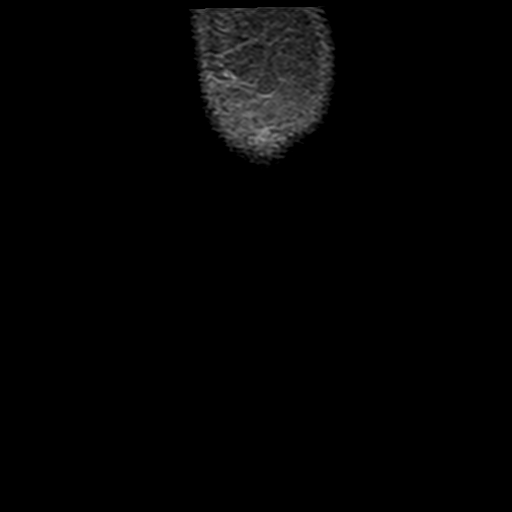
[im 4/25]
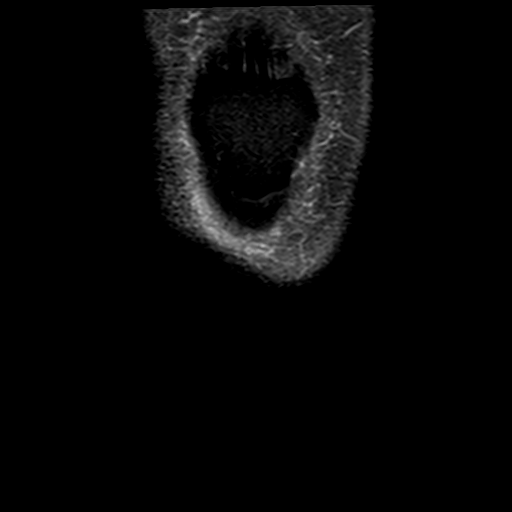
[im 7/25]
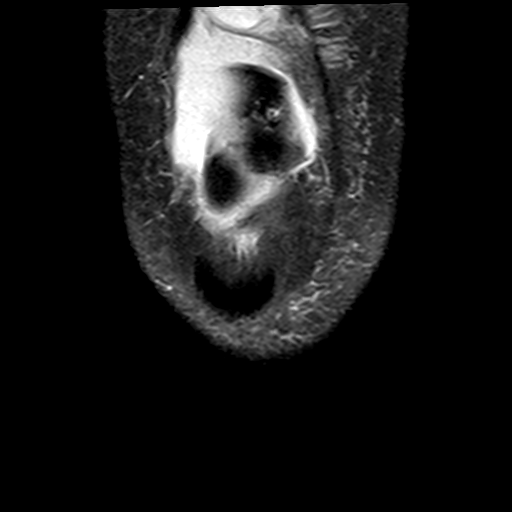
[im 10/25]
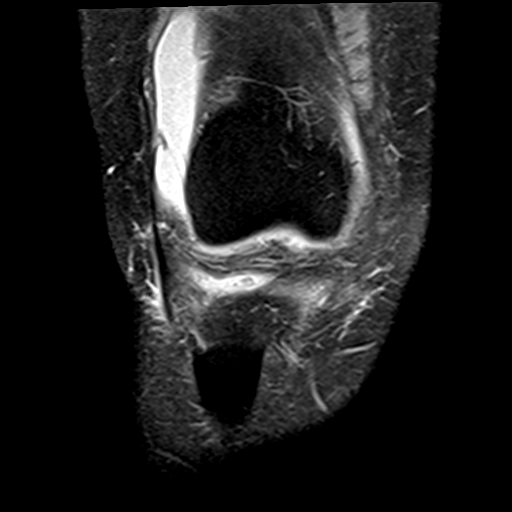
[im 13/25]
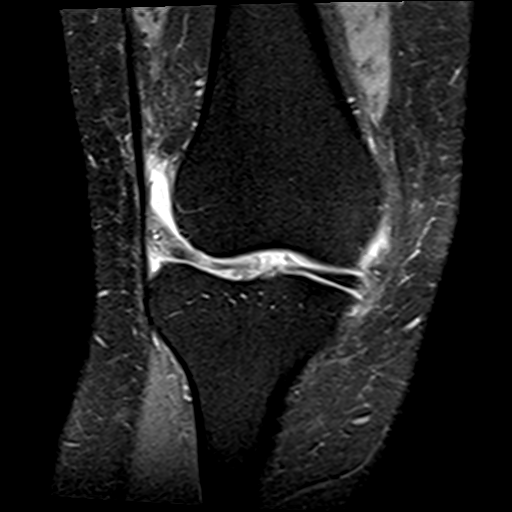
[im 22/25]
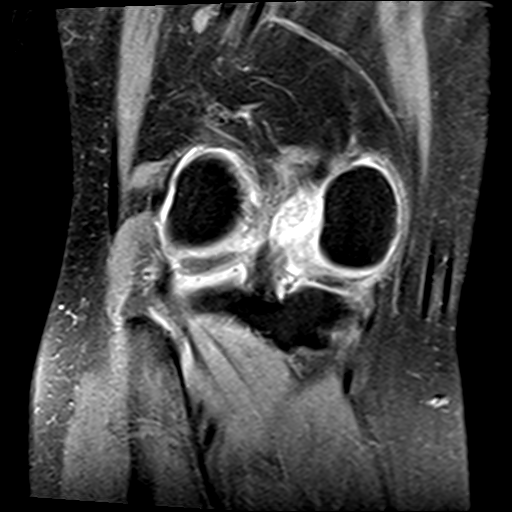

[22 of 40 positions shown; findings below may reference images not displayed]

FINDINGS: MENISCI

Medial meniscus:  Intact.

Lateral meniscus: Radial tear involving the free edge of the body
and posterior horn of the medial meniscus. Maceration of the
anterior horn of the lateral meniscus.

LIGAMENTS

Cruciates:  Intact ACL and PCL.

Collaterals: Medial collateral ligament is intact. Lateral
collateral ligament complex is intact.

CARTILAGE

Patellofemoral: Partial-thickness cartilage loss of the
patellofemoral compartment.

Medial: Partial-thickness cartilage loss of the medial femorotibial
compartment with areas of high-grade partial-thickness cartilage
loss along the weight-bearing surface.

Lateral: High-grade partial-thickness cartilage loss with large
areas of full-thickness cartilage loss of the lateral femoral
condyle and lateral tibial plateau.

Joint: Large joint effusion. Mild edema in Hoffa's fat. No plical
thickening.

Popliteal Fossa:  No Baker cyst. Intact popliteus tendon.

Extensor Mechanism: Intact quadriceps tendon. Intact patellar
tendon. Intact medial patellar retinaculum. Intact lateral patellar
retinaculum. Intact MPFL.

Bones:  No acute osseous abnormality.  No fracture or dislocation.

Other: No fluid collection or hematoma.
IMPRESSION: 1. Radial tear involving the free edge of the body and posterior
horn of the medial meniscus. Maceration of the anterior horn of the
lateral meniscus.
2. Tricompartmental cartilage abnormalities as described above most
severe in the lateral femorotibial compartment.
3. Large joint effusion.

## 2019-03-09 DIAGNOSIS — F410 Panic disorder [episodic paroxysmal anxiety] without agoraphobia: Secondary | ICD-10-CM | POA: Diagnosis not present

## 2019-03-09 DIAGNOSIS — F900 Attention-deficit hyperactivity disorder, predominantly inattentive type: Secondary | ICD-10-CM | POA: Diagnosis not present

## 2019-03-09 DIAGNOSIS — F3342 Major depressive disorder, recurrent, in full remission: Secondary | ICD-10-CM | POA: Diagnosis not present

## 2019-03-22 DIAGNOSIS — E7800 Pure hypercholesterolemia, unspecified: Secondary | ICD-10-CM | POA: Diagnosis not present

## 2019-03-22 DIAGNOSIS — E1121 Type 2 diabetes mellitus with diabetic nephropathy: Secondary | ICD-10-CM | POA: Diagnosis not present

## 2019-03-22 DIAGNOSIS — N189 Chronic kidney disease, unspecified: Secondary | ICD-10-CM | POA: Diagnosis not present

## 2019-05-16 DIAGNOSIS — Z01419 Encounter for gynecological examination (general) (routine) without abnormal findings: Secondary | ICD-10-CM | POA: Diagnosis not present

## 2019-05-16 DIAGNOSIS — R51 Headache: Secondary | ICD-10-CM | POA: Diagnosis not present

## 2019-05-16 DIAGNOSIS — Z6828 Body mass index (BMI) 28.0-28.9, adult: Secondary | ICD-10-CM | POA: Diagnosis not present

## 2019-05-16 DIAGNOSIS — G4700 Insomnia, unspecified: Secondary | ICD-10-CM | POA: Diagnosis not present

## 2019-05-16 DIAGNOSIS — Z1231 Encounter for screening mammogram for malignant neoplasm of breast: Secondary | ICD-10-CM | POA: Diagnosis not present

## 2019-06-19 DIAGNOSIS — E7800 Pure hypercholesterolemia, unspecified: Secondary | ICD-10-CM | POA: Diagnosis not present

## 2019-06-19 DIAGNOSIS — Z79899 Other long term (current) drug therapy: Secondary | ICD-10-CM | POA: Diagnosis not present

## 2019-06-19 DIAGNOSIS — E1121 Type 2 diabetes mellitus with diabetic nephropathy: Secondary | ICD-10-CM | POA: Diagnosis not present

## 2019-06-19 DIAGNOSIS — Z0000 Encounter for general adult medical examination without abnormal findings: Secondary | ICD-10-CM | POA: Diagnosis not present

## 2019-07-23 DIAGNOSIS — Z01812 Encounter for preprocedural laboratory examination: Secondary | ICD-10-CM | POA: Diagnosis not present

## 2019-07-26 DIAGNOSIS — Z1211 Encounter for screening for malignant neoplasm of colon: Secondary | ICD-10-CM | POA: Diagnosis not present

## 2019-07-26 DIAGNOSIS — K648 Other hemorrhoids: Secondary | ICD-10-CM | POA: Diagnosis not present

## 2019-07-30 DIAGNOSIS — L720 Epidermal cyst: Secondary | ICD-10-CM | POA: Diagnosis not present

## 2019-07-30 DIAGNOSIS — B360 Pityriasis versicolor: Secondary | ICD-10-CM | POA: Diagnosis not present

## 2019-07-30 DIAGNOSIS — Z23 Encounter for immunization: Secondary | ICD-10-CM | POA: Diagnosis not present

## 2019-08-02 DIAGNOSIS — Z13820 Encounter for screening for osteoporosis: Secondary | ICD-10-CM | POA: Diagnosis not present

## 2019-08-15 DIAGNOSIS — K589 Irritable bowel syndrome without diarrhea: Secondary | ICD-10-CM | POA: Diagnosis not present

## 2019-08-23 DIAGNOSIS — Z23 Encounter for immunization: Secondary | ICD-10-CM | POA: Diagnosis not present

## 2019-08-23 DIAGNOSIS — L821 Other seborrheic keratosis: Secondary | ICD-10-CM | POA: Diagnosis not present

## 2019-10-03 DIAGNOSIS — F3342 Major depressive disorder, recurrent, in full remission: Secondary | ICD-10-CM | POA: Diagnosis not present

## 2019-10-03 DIAGNOSIS — F900 Attention-deficit hyperactivity disorder, predominantly inattentive type: Secondary | ICD-10-CM | POA: Diagnosis not present

## 2019-10-03 DIAGNOSIS — F411 Generalized anxiety disorder: Secondary | ICD-10-CM | POA: Diagnosis not present

## 2019-12-11 DIAGNOSIS — D485 Neoplasm of uncertain behavior of skin: Secondary | ICD-10-CM | POA: Diagnosis not present

## 2019-12-11 DIAGNOSIS — D2239 Melanocytic nevi of other parts of face: Secondary | ICD-10-CM | POA: Diagnosis not present

## 2019-12-14 DIAGNOSIS — E119 Type 2 diabetes mellitus without complications: Secondary | ICD-10-CM | POA: Diagnosis not present

## 2019-12-14 DIAGNOSIS — H5213 Myopia, bilateral: Secondary | ICD-10-CM | POA: Diagnosis not present

## 2020-01-18 ENCOUNTER — Ambulatory Visit: Payer: BLUE CROSS/BLUE SHIELD

## 2020-02-11 ENCOUNTER — Ambulatory Visit: Payer: BC Managed Care – PPO

## 2020-03-27 DIAGNOSIS — F3342 Major depressive disorder, recurrent, in full remission: Secondary | ICD-10-CM | POA: Diagnosis not present

## 2020-03-27 DIAGNOSIS — F410 Panic disorder [episodic paroxysmal anxiety] without agoraphobia: Secondary | ICD-10-CM | POA: Diagnosis not present

## 2020-03-27 DIAGNOSIS — F900 Attention-deficit hyperactivity disorder, predominantly inattentive type: Secondary | ICD-10-CM | POA: Diagnosis not present

## 2020-06-10 DIAGNOSIS — R197 Diarrhea, unspecified: Secondary | ICD-10-CM | POA: Diagnosis not present

## 2020-07-29 DIAGNOSIS — Z6829 Body mass index (BMI) 29.0-29.9, adult: Secondary | ICD-10-CM | POA: Diagnosis not present

## 2020-07-29 DIAGNOSIS — Z1231 Encounter for screening mammogram for malignant neoplasm of breast: Secondary | ICD-10-CM | POA: Diagnosis not present

## 2020-07-29 DIAGNOSIS — Z01419 Encounter for gynecological examination (general) (routine) without abnormal findings: Secondary | ICD-10-CM | POA: Diagnosis not present

## 2020-08-18 DIAGNOSIS — E1121 Type 2 diabetes mellitus with diabetic nephropathy: Secondary | ICD-10-CM | POA: Diagnosis not present

## 2020-08-18 DIAGNOSIS — Z0000 Encounter for general adult medical examination without abnormal findings: Secondary | ICD-10-CM | POA: Diagnosis not present

## 2020-08-18 DIAGNOSIS — E7800 Pure hypercholesterolemia, unspecified: Secondary | ICD-10-CM | POA: Diagnosis not present

## 2020-08-18 DIAGNOSIS — Z79899 Other long term (current) drug therapy: Secondary | ICD-10-CM | POA: Diagnosis not present

## 2020-08-18 DIAGNOSIS — Z23 Encounter for immunization: Secondary | ICD-10-CM | POA: Diagnosis not present

## 2020-09-23 DIAGNOSIS — F900 Attention-deficit hyperactivity disorder, predominantly inattentive type: Secondary | ICD-10-CM | POA: Diagnosis not present

## 2020-09-23 DIAGNOSIS — F3342 Major depressive disorder, recurrent, in full remission: Secondary | ICD-10-CM | POA: Diagnosis not present

## 2020-09-23 DIAGNOSIS — F410 Panic disorder [episodic paroxysmal anxiety] without agoraphobia: Secondary | ICD-10-CM | POA: Diagnosis not present

## 2020-10-29 DIAGNOSIS — E559 Vitamin D deficiency, unspecified: Secondary | ICD-10-CM | POA: Diagnosis not present

## 2020-11-21 DIAGNOSIS — Z20822 Contact with and (suspected) exposure to covid-19: Secondary | ICD-10-CM | POA: Diagnosis not present

## 2020-11-21 DIAGNOSIS — Z03818 Encounter for observation for suspected exposure to other biological agents ruled out: Secondary | ICD-10-CM | POA: Diagnosis not present

## 2020-12-19 DIAGNOSIS — E119 Type 2 diabetes mellitus without complications: Secondary | ICD-10-CM | POA: Diagnosis not present

## 2020-12-19 DIAGNOSIS — H5213 Myopia, bilateral: Secondary | ICD-10-CM | POA: Diagnosis not present

## 2021-03-12 DIAGNOSIS — R238 Other skin changes: Secondary | ICD-10-CM | POA: Diagnosis not present

## 2021-03-17 DIAGNOSIS — F410 Panic disorder [episodic paroxysmal anxiety] without agoraphobia: Secondary | ICD-10-CM | POA: Diagnosis not present

## 2021-03-17 DIAGNOSIS — F3342 Major depressive disorder, recurrent, in full remission: Secondary | ICD-10-CM | POA: Diagnosis not present

## 2021-03-17 DIAGNOSIS — F900 Attention-deficit hyperactivity disorder, predominantly inattentive type: Secondary | ICD-10-CM | POA: Diagnosis not present

## 2021-04-29 ENCOUNTER — Ambulatory Visit (INDEPENDENT_AMBULATORY_CARE_PROVIDER_SITE_OTHER): Payer: BC Managed Care – PPO | Admitting: Orthopedic Surgery

## 2021-04-29 ENCOUNTER — Other Ambulatory Visit: Payer: Self-pay

## 2021-04-29 ENCOUNTER — Ambulatory Visit: Payer: Self-pay

## 2021-04-29 ENCOUNTER — Encounter: Payer: Self-pay | Admitting: Orthopedic Surgery

## 2021-04-29 DIAGNOSIS — M25522 Pain in left elbow: Secondary | ICD-10-CM

## 2021-05-04 NOTE — Progress Notes (Signed)
Office Visit Note   Patient: Margaret Higgins           Date of Birth: 28-Oct-1966           MRN: [id number] Visit Date: 04/29/2021 Requested by: Mila Palmer, MD [address] [place],  [place] [address] PCP: Mila Palmer, MD  Subjective: Chief Complaint  Patient presents with   Left Elbow - Pain    HPI: Margaret Higgins is a 55 year old patient with left elbow injury 03/18/2021.  She fell on May 18.  She does not physical work.  Ibuprofen has not been very helpful.  She does have a history of diabetes.  Localizes pain to the lateral epicondyle.  Her fall was on the elbow itself.  The elbow did not swell too much.  Denies any mechanical symptoms.  Full extension is painful.  She is right-hand dominant but the left elbow does ache at night.  She is okay to type but has trouble lifting heavy things such as a gallon of milk.              ROS: All systems reviewed are negative as they relate to the chief complaint within the history of present illness.  Patient denies  fevers or chills.   Assessment & Plan: Visit Diagnoses:  1. Pain in left elbow     Plan: Impression is good range of motion with lateral sided tenderness.  Biceps tendon triceps tendon palpable and intact.  Patient has tenderness with resisted wrist extension lateralizing to the lateral epicondyle.  Elbow is stable with range of motion.  Plan is counterforce brace with 6-week return plus or minus MRI scan then to evaluate for ligamentous injury or occult bony injury.  Follow-Up Instructions: Return in about 6 weeks (around 06/10/2021).   Orders:  Orders Placed This Encounter  Procedures   XR Elbow 2 Views Left   No orders of the defined types were placed in this encounter.     Procedures: No procedures performed   Clinical Data: No additional findings.  Objective: Vital Signs: There were no vitals taken for this visit.  Physical Exam:   Constitutional: Patient appears well-developed HEENT:   Head: Normocephalic Eyes:EOM are normal Neck: Normal range of motion Cardiovascular: Normal rate Pulmonary/chest: Effort normal Neurologic: Patient is alert Skin: Skin is warm Psychiatric: Patient has normal mood and affect   Ortho Exam: Ortho exam demonstrates good cervical spine range of motion.  5 out of 5 grip EPL FPL interosseous wrist flexion wrist extension bicep triceps and deltoid strength.  Patient does have some pain with resisted wrist extension on the left compared to the right.  Full pronation supination is present.  Radial pulses intact.  No other masses adenopathy or skin changes noted in that left elbow region.  Biceps tendon is palpable.  No subluxation of the ulnar nerve is present.  Specialty Comments:  No specialty comments available.  Imaging: No results found.   PMFS History: There are no problems to display for this patient.  Past Medical History:  Diagnosis Date   ADD (attention deficit disorder)    CKD (chronic kidney disease)    Diabetic neuropathy (HCC)    Gestational diabetes    Hypercholesterolemia    Hyperlipidemia    Migraine     Family History  Problem Relation Age of Onset   Heart attack Mother    Parkinson's disease Mother    CAD Mother    Alzheimer's disease Father    Diabetes Father  Hypertension Father     History reviewed. No pertinent surgical history. Social History   Occupational History   Occupation: Arts development officer  Tobacco Use   Smoking status: Former    Pack years: 0.00   Smokeless tobacco: Never  Vaping Use   Vaping Use: Never used  Substance and Sexual Activity   Alcohol use: No   Drug use: No   Sexual activity: Not on file

## 2021-08-17 DIAGNOSIS — K648 Other hemorrhoids: Secondary | ICD-10-CM | POA: Diagnosis not present

## 2021-08-17 DIAGNOSIS — K589 Irritable bowel syndrome without diarrhea: Secondary | ICD-10-CM | POA: Diagnosis not present

## 2021-09-01 DIAGNOSIS — Z79899 Other long term (current) drug therapy: Secondary | ICD-10-CM | POA: Diagnosis not present

## 2021-09-01 DIAGNOSIS — Z794 Long term (current) use of insulin: Secondary | ICD-10-CM | POA: Diagnosis not present

## 2021-09-01 DIAGNOSIS — Z23 Encounter for immunization: Secondary | ICD-10-CM | POA: Diagnosis not present

## 2021-09-01 DIAGNOSIS — E7800 Pure hypercholesterolemia, unspecified: Secondary | ICD-10-CM | POA: Diagnosis not present

## 2021-09-01 DIAGNOSIS — E1121 Type 2 diabetes mellitus with diabetic nephropathy: Secondary | ICD-10-CM | POA: Diagnosis not present

## 2021-09-08 DIAGNOSIS — F411 Generalized anxiety disorder: Secondary | ICD-10-CM | POA: Diagnosis not present

## 2021-09-08 DIAGNOSIS — F3342 Major depressive disorder, recurrent, in full remission: Secondary | ICD-10-CM | POA: Diagnosis not present

## 2021-09-08 DIAGNOSIS — F900 Attention-deficit hyperactivity disorder, predominantly inattentive type: Secondary | ICD-10-CM | POA: Diagnosis not present

## 2021-09-22 DIAGNOSIS — Z1231 Encounter for screening mammogram for malignant neoplasm of breast: Secondary | ICD-10-CM | POA: Diagnosis not present

## 2021-09-22 DIAGNOSIS — Z87410 Personal history of cervical dysplasia: Secondary | ICD-10-CM | POA: Insufficient documentation

## 2021-09-22 DIAGNOSIS — Z01419 Encounter for gynecological examination (general) (routine) without abnormal findings: Secondary | ICD-10-CM | POA: Diagnosis not present

## 2021-09-22 DIAGNOSIS — Z6829 Body mass index (BMI) 29.0-29.9, adult: Secondary | ICD-10-CM | POA: Diagnosis not present

## 2021-09-23 ENCOUNTER — Other Ambulatory Visit: Payer: Self-pay | Admitting: Obstetrics and Gynecology

## 2021-09-23 DIAGNOSIS — Z8249 Family history of ischemic heart disease and other diseases of the circulatory system: Secondary | ICD-10-CM

## 2021-12-22 DIAGNOSIS — H52203 Unspecified astigmatism, bilateral: Secondary | ICD-10-CM | POA: Diagnosis not present

## 2021-12-22 DIAGNOSIS — E119 Type 2 diabetes mellitus without complications: Secondary | ICD-10-CM | POA: Diagnosis not present

## 2022-02-25 DIAGNOSIS — F3342 Major depressive disorder, recurrent, in full remission: Secondary | ICD-10-CM | POA: Diagnosis not present

## 2022-02-25 DIAGNOSIS — F411 Generalized anxiety disorder: Secondary | ICD-10-CM | POA: Diagnosis not present

## 2022-02-25 DIAGNOSIS — F900 Attention-deficit hyperactivity disorder, predominantly inattentive type: Secondary | ICD-10-CM | POA: Diagnosis not present

## 2022-04-06 DIAGNOSIS — F411 Generalized anxiety disorder: Secondary | ICD-10-CM | POA: Diagnosis not present

## 2022-04-06 DIAGNOSIS — E1121 Type 2 diabetes mellitus with diabetic nephropathy: Secondary | ICD-10-CM | POA: Diagnosis not present

## 2022-04-30 DIAGNOSIS — D229 Melanocytic nevi, unspecified: Secondary | ICD-10-CM | POA: Diagnosis not present

## 2022-04-30 DIAGNOSIS — L570 Actinic keratosis: Secondary | ICD-10-CM | POA: Diagnosis not present

## 2022-05-12 ENCOUNTER — Other Ambulatory Visit: Payer: Self-pay | Admitting: Physician Assistant

## 2022-05-12 DIAGNOSIS — K589 Irritable bowel syndrome without diarrhea: Secondary | ICD-10-CM | POA: Diagnosis not present

## 2022-05-12 DIAGNOSIS — R1031 Right lower quadrant pain: Secondary | ICD-10-CM | POA: Diagnosis not present

## 2022-05-12 DIAGNOSIS — R1013 Epigastric pain: Secondary | ICD-10-CM | POA: Diagnosis not present

## 2022-05-12 DIAGNOSIS — K219 Gastro-esophageal reflux disease without esophagitis: Secondary | ICD-10-CM | POA: Diagnosis not present

## 2022-07-07 DIAGNOSIS — U071 COVID-19: Secondary | ICD-10-CM | POA: Diagnosis not present

## 2022-08-09 DIAGNOSIS — K219 Gastro-esophageal reflux disease without esophagitis: Secondary | ICD-10-CM | POA: Diagnosis not present

## 2022-08-09 DIAGNOSIS — K589 Irritable bowel syndrome without diarrhea: Secondary | ICD-10-CM | POA: Diagnosis not present

## 2022-08-10 DIAGNOSIS — Z0000 Encounter for general adult medical examination without abnormal findings: Secondary | ICD-10-CM | POA: Diagnosis not present

## 2022-08-10 DIAGNOSIS — E119 Type 2 diabetes mellitus without complications: Secondary | ICD-10-CM | POA: Diagnosis not present

## 2022-08-10 DIAGNOSIS — Z79899 Other long term (current) drug therapy: Secondary | ICD-10-CM | POA: Diagnosis not present

## 2022-08-10 DIAGNOSIS — E559 Vitamin D deficiency, unspecified: Secondary | ICD-10-CM | POA: Diagnosis not present

## 2022-08-19 DIAGNOSIS — F4321 Adjustment disorder with depressed mood: Secondary | ICD-10-CM | POA: Diagnosis not present

## 2022-08-19 DIAGNOSIS — F900 Attention-deficit hyperactivity disorder, predominantly inattentive type: Secondary | ICD-10-CM | POA: Diagnosis not present

## 2022-08-19 DIAGNOSIS — F3342 Major depressive disorder, recurrent, in full remission: Secondary | ICD-10-CM | POA: Diagnosis not present

## 2022-09-03 DIAGNOSIS — N3000 Acute cystitis without hematuria: Secondary | ICD-10-CM | POA: Diagnosis not present

## 2022-09-03 DIAGNOSIS — R350 Frequency of micturition: Secondary | ICD-10-CM | POA: Diagnosis not present

## 2022-10-18 DIAGNOSIS — F411 Generalized anxiety disorder: Secondary | ICD-10-CM | POA: Diagnosis not present

## 2022-10-18 DIAGNOSIS — J101 Influenza due to other identified influenza virus with other respiratory manifestations: Secondary | ICD-10-CM | POA: Diagnosis not present

## 2022-11-03 DIAGNOSIS — Z124 Encounter for screening for malignant neoplasm of cervix: Secondary | ICD-10-CM | POA: Diagnosis not present

## 2022-11-03 DIAGNOSIS — Z01419 Encounter for gynecological examination (general) (routine) without abnormal findings: Secondary | ICD-10-CM | POA: Diagnosis not present

## 2022-11-03 DIAGNOSIS — Z1151 Encounter for screening for human papillomavirus (HPV): Secondary | ICD-10-CM | POA: Diagnosis not present

## 2022-11-03 DIAGNOSIS — Z6825 Body mass index (BMI) 25.0-25.9, adult: Secondary | ICD-10-CM | POA: Diagnosis not present

## 2022-11-03 DIAGNOSIS — Z0000 Encounter for general adult medical examination without abnormal findings: Secondary | ICD-10-CM | POA: Diagnosis not present

## 2022-11-03 DIAGNOSIS — Z1231 Encounter for screening mammogram for malignant neoplasm of breast: Secondary | ICD-10-CM | POA: Diagnosis not present

## 2022-12-14 DIAGNOSIS — N3000 Acute cystitis without hematuria: Secondary | ICD-10-CM | POA: Diagnosis not present

## 2022-12-31 DIAGNOSIS — Z419 Encounter for procedure for purposes other than remedying health state, unspecified: Secondary | ICD-10-CM | POA: Diagnosis not present

## 2023-01-10 DIAGNOSIS — E119 Type 2 diabetes mellitus without complications: Secondary | ICD-10-CM | POA: Diagnosis not present

## 2023-01-10 DIAGNOSIS — H5213 Myopia, bilateral: Secondary | ICD-10-CM | POA: Diagnosis not present

## 2023-01-10 DIAGNOSIS — H2513 Age-related nuclear cataract, bilateral: Secondary | ICD-10-CM | POA: Diagnosis not present

## 2023-01-31 DIAGNOSIS — Z419 Encounter for procedure for purposes other than remedying health state, unspecified: Secondary | ICD-10-CM | POA: Diagnosis not present

## 2023-03-02 DIAGNOSIS — Z419 Encounter for procedure for purposes other than remedying health state, unspecified: Secondary | ICD-10-CM | POA: Diagnosis not present

## 2023-04-02 DIAGNOSIS — Z419 Encounter for procedure for purposes other than remedying health state, unspecified: Secondary | ICD-10-CM | POA: Diagnosis not present

## 2023-04-11 NOTE — Progress Notes (Unsigned)
[facility] [address] [place] [address] Dept: [phone] Dept Fax: [phone]  New Patient Office Visit  Subjective:   Ajane Novella 04-19-1966 04/12/2023  No chief complaint on file.   HPI: Bryn Saline presents today to establish care at [facility]. Introduced to Publishing rights manager role and practice setting.  All questions answered.   Last PCP: Mila Palmer Last annual physical: unknown  Concerns: see below   DIABETES MELLITUS: Jadon Ressler presents for the medical management of diabetes.  Current diabetes medication regimen: *** Patient is *** adhering to a diabetic diet.  Patient is *** checking BS regularly. Avg:  Patient is *** checking their feet regularly.  Denies polydipsia, polyphagia, polyuria.  No results found for: "HGBA1C"        The following portions of the patient's history were reviewed and updated as appropriate: past medical history, past surgical history, family history, social history, allergies, medications, and problem list.   There are no problems to display for this patient.  Past Medical History:  Diagnosis Date   ADD (attention deficit disorder)    CKD (chronic kidney disease)    Diabetic neuropathy (HCC)    Gestational diabetes    Hypercholesterolemia    Hyperlipidemia    Migraine    No past surgical history on file. Family History  Problem Relation Age of Onset   Heart attack Mother    Parkinson's disease Mother    CAD Mother    Alzheimer's disease Father    Diabetes Father    Hypertension Father    Outpatient Medications Prior to Visit  Medication Sig Dispense Refill   amphetamine-dextroamphetamine (ADDERALL XR) 25 MG 24 hr capsule TK 2 CS PO QAM  0   BD PEN NEEDLE NANO U/F 32G X 4 MM MISC USE AS DIRECTED ONCE DAILY WITH LEVEMIR AND 3 TIMES DAILY FOR HUMALOG  4   Blood Glucose Monitoring Suppl (CONTOUR NEXT MONITOR)  w/Device KIT USE TO TEST BLOOD SUGAR BID  0   buPROPion (WELLBUTRIN XL) 150 MG 24 hr tablet TK 1 T PO QD  2   clotrimazole-betamethasone (LOTRISONE) cream APP EXT AA BID  5   Continuous Blood Gluc Receiver (FREESTYLE LIBRE READER) DEVI U UTD  0   Continuous Blood Gluc Sensor (FREESTYLE LIBRE SENSOR SYSTEM) MISC APPLY ONE SENSOR TO THE BACK OF UPPER ARM Q 10 DAYS  5   CONTOUR NEXT TEST test strip   2   diazepam (VALIUM) 5 MG tablet TK 1 T PO TID  5   diclofenac sodium (VOLTAREN) 1 % GEL APPLY 4 GRAMS EXTERNALLY TO THE AFFECTED AREA TWICE DAILY 100 g 0   ESTRACE VAGINAL 0.1 MG/GM vaginal cream   3   estradiol (VIVELLE-DOT) 0.05 MG/24HR patch USE UTD  11   halobetasol (ULTRAVATE) 0.05 % ointment APP AA BID  0   Insulin NPH Human, Isophane, (HUMULIN N [place]) Inject 12 Units into the skin at bedtime.     LEVEMIR FLEXTOUCH 100 UNIT/ML Pen   12   meloxicam (MOBIC) 15 MG tablet TK 1 T PO QD PRN  4   nitrofurantoin (MACRODANTIN) 100 MG capsule   3   phentermine (ADIPEX-P) 37.5 MG tablet Take 37.5 mg by mouth daily.  0   progesterone (PROMETRIUM) 100 MG capsule TK 1 C PO QHS  0   rizatriptan (MAXALT) 10 MG tablet TK 1 T PO ONCE A DAY PRN  1   simvastatin (ZOCOR) 20  MG tablet TK 1 T PO QPM  0   valACYclovir (VALTREX) 1000 MG tablet TK 2 TS PO NOW. REPEAT IN 12 H  3   VICTOZA 18 MG/3ML SOPN INJECT 1.2 MG SQ D  11   VIIBRYD 40 MG TABS TK 1 T PO QD WITH A FULL MEAL  12   No facility-administered medications prior to visit.   Allergies  Allergen Reactions   Codeine     ROS: A complete ROS was performed with pertinent positives/negatives noted in the HPI. The remainder of the ROS are negative.   Objective:   There were no vitals filed for this visit.  GENERAL: Well-appearing, in NAD. Well nourished.  SKIN: Pink, warm and dry. No rash, lesion, ulceration, or ecchymoses.  NECK: Trachea midline. Full ROM w/o pain or tenderness. No lymphadenopathy.  RESPIRATORY: Chest wall symmetrical. Respirations  even and non-labored. Breath sounds clear to auscultation bilaterally.  CARDIAC: S1, S2 present, regular rate and rhythm. Peripheral pulses 2+ bilaterally.  MSK: Muscle tone and strength appropriate for age. Joints w/o tenderness, redness, or swelling.  EXTREMITIES: Without clubbing, cyanosis, or edema.  NEUROLOGIC: No motor or sensory deficits. Steady, even gait.  PSYCH/MENTAL STATUS: Alert, oriented x 3. Cooperative, appropriate mood and affect.   Health Maintenance Due  Topic Date Due   COVID-19 Vaccine (1) Never done   HIV Screening  Never done   Hepatitis C Screening  Never done   DTaP/Tdap/Td (1 - Tdap) Never done   PAP SMEAR-Modifier  Never done   Colonoscopy  Never done   MAMMOGRAM  Never done   Zoster Vaccines- Shingrix (1 of 2) Never done    No results found for any visits on 04/12/23.     Assessment & Plan:  There are no diagnoses linked to this encounter.   No follow-ups on file.   Salvatore Decent, FNP

## 2023-04-12 ENCOUNTER — Ambulatory Visit (INDEPENDENT_AMBULATORY_CARE_PROVIDER_SITE_OTHER): Payer: Commercial Managed Care - HMO | Admitting: Internal Medicine

## 2023-04-12 ENCOUNTER — Encounter: Payer: Self-pay | Admitting: Internal Medicine

## 2023-04-12 VITALS — BP 106/70 | HR 75 | Temp 98.2°F | Ht 64.0 in | Wt 135.2 lb

## 2023-04-12 DIAGNOSIS — M1990 Unspecified osteoarthritis, unspecified site: Secondary | ICD-10-CM | POA: Insufficient documentation

## 2023-04-12 DIAGNOSIS — Z780 Asymptomatic menopausal state: Secondary | ICD-10-CM | POA: Diagnosis not present

## 2023-04-12 DIAGNOSIS — G43909 Migraine, unspecified, not intractable, without status migrainosus: Secondary | ICD-10-CM | POA: Insufficient documentation

## 2023-04-12 DIAGNOSIS — E119 Type 2 diabetes mellitus without complications: Secondary | ICD-10-CM | POA: Insufficient documentation

## 2023-04-12 DIAGNOSIS — K589 Irritable bowel syndrome without diarrhea: Secondary | ICD-10-CM | POA: Insufficient documentation

## 2023-04-12 DIAGNOSIS — F419 Anxiety disorder, unspecified: Secondary | ICD-10-CM | POA: Insufficient documentation

## 2023-04-12 DIAGNOSIS — F321 Major depressive disorder, single episode, moderate: Secondary | ICD-10-CM | POA: Diagnosis not present

## 2023-04-12 DIAGNOSIS — E785 Hyperlipidemia, unspecified: Secondary | ICD-10-CM | POA: Insufficient documentation

## 2023-04-12 DIAGNOSIS — Z794 Long term (current) use of insulin: Secondary | ICD-10-CM | POA: Diagnosis not present

## 2023-04-12 DIAGNOSIS — E1165 Type 2 diabetes mellitus with hyperglycemia: Secondary | ICD-10-CM

## 2023-04-12 DIAGNOSIS — F900 Attention-deficit hyperactivity disorder, predominantly inattentive type: Secondary | ICD-10-CM | POA: Insufficient documentation

## 2023-04-12 LAB — COMPREHENSIVE METABOLIC PANEL
ALT: 10 U/L (ref 0–35)
AST: 14 U/L (ref 0–37)
Albumin: 4.3 g/dL (ref 3.5–5.2)
Alkaline Phosphatase: 56 U/L (ref 39–117)
BUN: 9 mg/dL (ref 6–23)
CO2: 27 mEq/L (ref 19–32)
Calcium: 9.6 mg/dL (ref 8.4–10.5)
Chloride: 103 mEq/L (ref 96–112)
Creatinine, Ser: 0.76 mg/dL (ref 0.40–1.20)
GFR: 86.05 mL/min (ref >60.00)
Glucose, Bld: 136 mg/dL — ABNORMAL HIGH (ref 70–99)
Potassium: 4.3 mEq/L (ref 3.5–5.1)
Sodium: 137 mEq/L (ref 135–145)
Total Bilirubin: 0.8 mg/dL (ref 0.2–1.2)
Total Protein: 7.1 g/dL (ref 6.0–8.3)

## 2023-04-12 LAB — MICROALBUMIN / CREATININE URINE RATIO
Creatinine,U: 248.8 mg/dL
Microalb Creat Ratio: 0.9 mg/g (ref 0.0–30.0)
Microalb, Ur: 2.3 mg/dL — ABNORMAL HIGH (ref 0.0–1.9)

## 2023-04-12 LAB — HEMOGLOBIN A1C: Hgb A1c MFr Bld: 5.8 % (ref 4.6–6.5)

## 2023-04-12 MED ORDER — CLONAZEPAM 1 MG PO TABS
1.0000 mg | ORAL_TABLET | Freq: Three times a day (TID) | ORAL | 2 refills | Status: AC | PRN
Start: 2023-04-12 — End: ?

## 2023-04-12 MED ORDER — BUPROPION HCL ER (XL) 150 MG PO TB24
150.0000 mg | ORAL_TABLET | Freq: Every day | ORAL | 1 refills | Status: DC
Start: 2023-04-12 — End: 2023-05-24

## 2023-04-12 NOTE — Assessment & Plan Note (Signed)
Continue Levemir 12 units QHS  Continue Novolog 5units PRN CBG > 200  Continue Tresiba - patient will call to other pharmacies ([facility], [facility]) if no longer in stock will try to get Ozempic or other GLP1 Labs ordered

## 2023-04-12 NOTE — Assessment & Plan Note (Signed)
Start Wellbutrin XL 150mg  PO daily Clonazepam TID PRN anxiety - UDS and controlled substance agreement signed.  Return in 6 weeks for follow up. Will increase Wellbutrin dose/start Vibryd depending on how well patient is doing

## 2023-04-15 LAB — AMPHETAMINE CONF, UR
Amphetamine GC/MS Conf: >3000 ng/mL
Amphetamine: POSITIVE — AB
Amphetamines: POSITIVE — AB
Methamphetamine: NEGATIVE

## 2023-04-15 LAB — URINE DRUGS OF ABUSE SCREEN W ALC, ROUTINE (REF LAB)
Barbiturate Quant, Ur: NEGATIVE ng/mL
Cannabinoid Quant, Ur: NEGATIVE ng/mL
Cocaine (Metab.): NEGATIVE ng/mL
Ethanol, Urine: NEGATIVE %
Methadone Screen, Urine: NEGATIVE ng/mL
Opiate Quant, Ur: NEGATIVE ng/mL
PCP Quant, Ur: NEGATIVE ng/mL
Propoxyphene: NEGATIVE ng/mL

## 2023-04-15 LAB — DRUG PROFILE 799031
BENZODIAZEPINES: POSITIVE — AB
HYDROXYALPRAZOLAM: NEGATIVE
NORDIAZEPAM GC/MS CONF: 515 ng/mL
NORDIAZEPAM: POSITIVE — AB
Oxazepam GC/MS Conf: 2485 ng/mL
Oxazepam: POSITIVE — AB

## 2023-04-15 NOTE — Progress Notes (Signed)
Noted  

## 2023-04-18 ENCOUNTER — Other Ambulatory Visit: Payer: Self-pay | Admitting: Internal Medicine

## 2023-04-18 NOTE — Telephone Encounter (Addendum)
I called and spoke with patient and notified her of the Rx refused and patient said she has taken this medication for 6 years and was prescribed this by Dr. Evelene Croon.  Patient said that she is also out of Adderrall 30mg 

## 2023-04-22 ENCOUNTER — Telehealth: Payer: Self-pay | Admitting: Internal Medicine

## 2023-04-22 ENCOUNTER — Telehealth: Payer: Self-pay

## 2023-04-22 ENCOUNTER — Encounter: Payer: Self-pay | Admitting: Internal Medicine

## 2023-04-22 NOTE — Telephone Encounter (Signed)
Pt is requesting a refill for this med VIIBRYD 40 MG TABS [[id number]] She stated that it is an ongoing script and does not understand why this has been denied. Please call to clarify.

## 2023-04-22 NOTE — Telephone Encounter (Signed)
I called patient to get clarification on refill request and patient stated that now that she on Medicare she will not be seeing Dr.Kaur and is asking if PCP will take over ordering medications.  Patient also stated she suffering from withdraws.  Please advise

## 2023-04-25 MED ORDER — VIIBRYD 40 MG PO TABS: ORAL_TABLET | ORAL | 12 refills | Status: DC

## 2023-04-25 NOTE — Telephone Encounter (Signed)
The patient stated at her visit she was not taking the Vibryd at the moment. That is why we just re-started her Wellbutrin only. If she IS taking the vibryd she needs to notify us of the dose and it is okay to go ahead and send in.   The refill was previously denied because she had stated at her visit she was not taking the Vibryd currently.

## 2023-04-25 NOTE — Addendum Note (Signed)
Addended by: Mary Sella D on: 04/25/2023 11:17 AM   Modules accepted: Orders

## 2023-05-02 DIAGNOSIS — Z419 Encounter for procedure for purposes other than remedying health state, unspecified: Secondary | ICD-10-CM | POA: Diagnosis not present

## 2023-05-16 ENCOUNTER — Telehealth: Payer: Self-pay | Admitting: Internal Medicine

## 2023-05-16 NOTE — Telephone Encounter (Signed)
Pt can not get her TRULICITY 3 MG/0.5ML SOPN [[id number]] script. It is out of stock for the state is what she is being told. She was able to find Ozempic at [facility]  Address: [address], [place], [place] [address] Phone: [phone]. She would like her Trulicity changed to Ozempic and sent to [facility].  Pt at [phone]. She has been off her script for over a week.

## 2023-05-17 NOTE — Telephone Encounter (Signed)
Sending  message to [place]. Pt was checking on the status.

## 2023-05-18 ENCOUNTER — Other Ambulatory Visit: Payer: Self-pay | Admitting: Internal Medicine

## 2023-05-18 DIAGNOSIS — E1165 Type 2 diabetes mellitus with hyperglycemia: Secondary | ICD-10-CM

## 2023-05-18 MED ORDER — SEMAGLUTIDE(0.25 OR 0.5MG/DOS) 2 MG/3ML ~~LOC~~ SOPN
0.5000 mg | PEN_INJECTOR | SUBCUTANEOUS | 2 refills | Status: DC
Start: 2023-05-18 — End: 2023-09-05

## 2023-05-18 NOTE — Telephone Encounter (Signed)
Rx sent in for Ozempic  Discontinue the Trulicity

## 2023-05-18 NOTE — Telephone Encounter (Signed)
 Patient informed of message  

## 2023-05-20 ENCOUNTER — Emergency Department (HOSPITAL_BASED_OUTPATIENT_CLINIC_OR_DEPARTMENT_OTHER)
Admission: EM | Admit: 2023-05-20 | Discharge: 2023-05-21 | Disposition: A | Discharge status: Home / Self Care | Payer: Medicaid Other | Attending: Emergency Medicine | Admitting: Emergency Medicine

## 2023-05-20 ENCOUNTER — Other Ambulatory Visit: Payer: Self-pay

## 2023-05-20 ENCOUNTER — Emergency Department (HOSPITAL_BASED_OUTPATIENT_CLINIC_OR_DEPARTMENT_OTHER): Payer: Medicaid Other

## 2023-05-20 ENCOUNTER — Emergency Department (HOSPITAL_BASED_OUTPATIENT_CLINIC_OR_DEPARTMENT_OTHER): Payer: Medicaid Other | Admitting: Radiology

## 2023-05-20 ENCOUNTER — Encounter (HOSPITAL_BASED_OUTPATIENT_CLINIC_OR_DEPARTMENT_OTHER): Payer: Self-pay | Admitting: Emergency Medicine

## 2023-05-20 DIAGNOSIS — W06XXXA Fall from bed, initial encounter: Secondary | ICD-10-CM | POA: Diagnosis not present

## 2023-05-20 DIAGNOSIS — S299XXA Unspecified injury of thorax, initial encounter: Secondary | ICD-10-CM | POA: Diagnosis not present

## 2023-05-20 DIAGNOSIS — M79641 Pain in right hand: Secondary | ICD-10-CM | POA: Diagnosis not present

## 2023-05-20 DIAGNOSIS — S20212A Contusion of left front wall of thorax, initial encounter: Secondary | ICD-10-CM | POA: Diagnosis not present

## 2023-05-20 DIAGNOSIS — S3991XA Unspecified injury of abdomen, initial encounter: Secondary | ICD-10-CM | POA: Diagnosis not present

## 2023-05-20 DIAGNOSIS — R109 Unspecified abdominal pain: Secondary | ICD-10-CM | POA: Diagnosis not present

## 2023-05-20 DIAGNOSIS — Z794 Long term (current) use of insulin: Secondary | ICD-10-CM | POA: Insufficient documentation

## 2023-05-20 DIAGNOSIS — S3993XA Unspecified injury of pelvis, initial encounter: Secondary | ICD-10-CM | POA: Diagnosis not present

## 2023-05-20 DIAGNOSIS — E119 Type 2 diabetes mellitus without complications: Secondary | ICD-10-CM | POA: Diagnosis not present

## 2023-05-20 DIAGNOSIS — R0781 Pleurodynia: Secondary | ICD-10-CM | POA: Diagnosis not present

## 2023-05-20 DIAGNOSIS — W19XXXA Unspecified fall, initial encounter: Secondary | ICD-10-CM

## 2023-05-20 DIAGNOSIS — K5730 Diverticulosis of large intestine without perforation or abscess without bleeding: Secondary | ICD-10-CM | POA: Diagnosis not present

## 2023-05-20 DIAGNOSIS — J9811 Atelectasis: Secondary | ICD-10-CM | POA: Diagnosis not present

## 2023-05-20 LAB — CBC WITH DIFFERENTIAL/PLATELET
Abs Immature Granulocytes: 0.02 10*3/uL (ref 0.00–0.07)
Basophils Absolute: 0 10*3/uL (ref 0.0–0.1)
Basophils Relative: 0 %
Eosinophils Absolute: 0.1 10*3/uL (ref 0.0–0.5)
Eosinophils Relative: 1 %
HCT: 39.8 % (ref 36.0–46.0)
Hemoglobin: 13.6 g/dL (ref 12.0–15.0)
Immature Granulocytes: 0 %
Lymphocytes Relative: 35 %
Lymphs Abs: 2.2 10*3/uL (ref 0.7–4.0)
MCH: 32.2 pg (ref 26.0–34.0)
MCHC: 34.2 g/dL (ref 30.0–36.0)
MCV: 94.1 fL (ref 80.0–100.0)
Monocytes Absolute: 0.3 10*3/uL (ref 0.1–1.0)
Monocytes Relative: 5 %
Neutro Abs: 3.7 10*3/uL (ref 1.7–7.7)
Neutrophils Relative %: 59 %
Platelets: 198 10*3/uL (ref 150–400)
RBC: 4.23 MIL/uL (ref 3.87–5.11)
RDW: 12 % (ref 11.5–15.5)
WBC: 6.3 10*3/uL (ref 4.0–10.5)
nRBC: 0 % (ref 0.0–0.2)

## 2023-05-20 LAB — COMPREHENSIVE METABOLIC PANEL
ALT: 14 U/L (ref 0–44)
AST: 18 U/L (ref 15–41)
Albumin: 4.7 g/dL (ref 3.5–5.0)
Alkaline Phosphatase: 52 U/L (ref 38–126)
Anion gap: 10 (ref 5–15)
BUN: 12 mg/dL (ref 6–20)
CO2: 30 mmol/L (ref 22–32)
Calcium: 10.1 mg/dL (ref 8.9–10.3)
Chloride: 100 mmol/L (ref 98–111)
Creatinine, Ser: 0.71 mg/dL (ref 0.44–1.00)
GFR, Estimated: >60 mL/min (ref >60)
Glucose, Bld: 109 mg/dL — ABNORMAL HIGH (ref 70–99)
Potassium: 3.7 mmol/L (ref 3.5–5.1)
Sodium: 140 mmol/L (ref 135–145)
Total Bilirubin: 0.9 mg/dL (ref 0.3–1.2)
Total Protein: 7.5 g/dL (ref 6.5–8.1)

## 2023-05-20 LAB — TROPONIN I (HIGH SENSITIVITY): Troponin I (High Sensitivity): 3 ng/L (ref <18)

## 2023-05-20 MED ORDER — IOHEXOL 300 MG/ML  SOLN
100.0000 mL | Freq: Once | INTRAMUSCULAR | Status: AC | PRN
  Administered 2023-05-20: 75 mL via INTRAVENOUS

## 2023-05-20 NOTE — ED Triage Notes (Addendum)
Pt presents to ED POV. Pt c/o pain to R hand and L middle back s/p mechanical fall on wed. Pt reports that she felt fine after fall but last night she began having intermittent shooting back pain. Pt reports she did hit head, no LOC, no blood thinners. Ambulating at baseline.

## 2023-05-21 ENCOUNTER — Other Ambulatory Visit: Payer: Self-pay

## 2023-05-21 MED ORDER — LIDOCAINE 5 % EX PTCH
1.0000 | MEDICATED_PATCH | CUTANEOUS | Status: DC
  Administered 2023-05-21: 1 via TRANSDERMAL
  Filled 2023-05-21: qty 1

## 2023-05-21 MED ORDER — METHOCARBAMOL 500 MG PO TABS: 500.0000 mg | ORAL_TABLET | Freq: Two times a day (BID) | ORAL | 0 refills | Status: DC

## 2023-05-21 MED ORDER — KETOROLAC TROMETHAMINE 15 MG/ML IJ SOLN
15.0000 mg | Freq: Once | INTRAMUSCULAR | Status: AC
  Administered 2023-05-21: 15 mg via INTRAMUSCULAR

## 2023-05-21 MED ORDER — KETOROLAC TROMETHAMINE 15 MG/ML IJ SOLN
15.0000 mg | Freq: Once | INTRAMUSCULAR | Status: DC
  Filled 2023-05-21: qty 1

## 2023-05-21 NOTE — ED Notes (Signed)
RT educated pt on the proper use of IS. Pt goal 1250 mLs and was able to achieve 1500 mls. Pt tolerated well, able to teach back process to RT.     05/21/23 0049  Incentive Spirometry Tx  Level of Service Assisted by RCP  Frequency q1hr W/A  Treatment Tolerance Tolerated well  IS Goal (mL) (RN or RT) 1250 mL  IS - Achieved (mL) (RN, NT, or RT) 1500 mL

## 2023-05-21 NOTE — Discharge Instructions (Addendum)
You were seen in the ER today for evaluation after a fall.  Your CT imaging was unremarkable.  I likely think you bruised your ribs.  For this, I recommend topical lidocaine patches that you can pick up over-the-counter.  I also recommend 1000 mg of Tylenol and/or 600 mg of ibuprofen every 6 hours as needed for pain.  Will also send you home with a muscle laxer to take as needed.  Please do not drive or operate heavy machinery while on this medication as it can make you sleepy.  Please make sure you are using your incentive spirometer every hour while awake.  This will help prevent things like pneumonia.  I would like for you to follow-up with your primary care doctor in a weeks time for reevaluation.  If you have any concerns, new or worsening symptoms, please return to your nearest emergency department for evaluation.  Contact a health care provider if you have: Increased bruising or swelling. Pain that is not controlled with treatment. A fever. Get help right away if you: Have difficulty breathing or shortness of breath. Develop a continual cough, or you cough up thick or bloody mucus from your lungs (sputum). Feel nauseous or you vomit. Have pain in your abdomen. These symptoms may represent a serious problem that is an emergency. Do not wait to see if the symptoms will go away. Get medical help right away. Call your local emergency services (911 in the [place] Do not drive yourself to the hospital.

## 2023-05-24 ENCOUNTER — Ambulatory Visit (INDEPENDENT_AMBULATORY_CARE_PROVIDER_SITE_OTHER): Payer: Medicaid Other | Admitting: Internal Medicine

## 2023-05-24 ENCOUNTER — Other Ambulatory Visit: Payer: Self-pay | Admitting: Internal Medicine

## 2023-05-24 ENCOUNTER — Encounter: Payer: Self-pay | Admitting: Internal Medicine

## 2023-05-24 VITALS — BP 138/86 | HR 94 | Temp 97.8°F | Ht 64.0 in | Wt 132.6 lb

## 2023-05-24 DIAGNOSIS — F419 Anxiety disorder, unspecified: Secondary | ICD-10-CM

## 2023-05-24 DIAGNOSIS — F330 Major depressive disorder, recurrent, mild: Secondary | ICD-10-CM

## 2023-05-24 MED ORDER — BUPROPION HCL ER (XL) 300 MG PO TB24
300.0000 mg | ORAL_TABLET | Freq: Every day | ORAL | 3 refills | Status: DC
Start: 2023-05-24 — End: 2023-10-12

## 2023-05-24 NOTE — Progress Notes (Unsigned)
[facility] [address] [place] [address] Dept: [phone] Dept Fax: [phone]    Subjective:   Margaret Higgins 03/08/1966 05/24/2023  Chief Complaint  Patient presents with   Follow-up    Anxiety and Depression pt states things are much better after starting med    HPI: [name] presents today for re-assessment and management of chronic medical conditions.  Discussed the use of AI scribe software for clinical note transcription with the patient, who gave verbal consent to proceed.  History of Present Illness   The patient, with a history of anxiety and depression, reports significant improvement since restarting Viibryd 40mg  daily and Wellbutrin 150mg  daily. She had previously been on Wellbutrin 300mg  daily, but the dose was reduced due to confusion about her medication regimen. She reports noticing a difference since the reduction and prefers the higher dose. Denies SI/HI.         04/12/2023   10:17 AM  GAD 7 : Generalized Anxiety Score  Nervous, Anxious, on Edge 1  Control/stop worrying 3  Worry too much - different things 2  Trouble relaxing 0  Restless 0  Easily annoyed or irritable 3  Afraid - awful might happen 1  Total GAD 7 Score 10  Anxiety Difficulty Not difficult at all       04/12/2023   10:16 AM  Depression screen PHQ 2/9  Decreased Interest 3  Down, Depressed, Hopeless 2  PHQ - 2 Score 5  Altered sleeping 3  Tired, decreased energy 3  Change in appetite 0  Feeling bad or failure about yourself  0  Trouble concentrating 2  Moving slowly or fidgety/restless 0  Suicidal thoughts 0  PHQ-9 Score 13  Difficult doing work/chores Not difficult at all       The following portions of the patient's history were reviewed and updated as appropriate: past medical history, past surgical history, family history, social history, allergies, medications, and problem list.   Patient  Active Problem List   Diagnosis Date Noted   Attention deficit hyperactivity disorder, predominantly inattentive type 04/12/2023   Diabetes mellitus (HCC) 04/12/2023   Hyperlipidemia 04/12/2023   Irritable bowel syndrome 04/12/2023   Osteoarthritis 04/12/2023   Migraine 04/12/2023   Anxiety 04/12/2023   Depression, major, single episode, moderate (HCC) 04/12/2023   Post-menopausal 04/12/2023   History of cervical dysplasia 09/22/2021   Past Medical History:  Diagnosis Date   ADD (attention deficit disorder)    CKD (chronic kidney disease)    Diabetic neuropathy (HCC)    Gestational diabetes    Hypercholesterolemia    Hyperlipidemia    Migraine    History reviewed. No pertinent surgical history. Family History  Problem Relation Age of Onset   Heart attack Mother    Parkinson's disease Mother    CAD Mother    Alzheimer's disease Father    Diabetes Father    Hypertension Father    Outpatient Medications Prior to Visit  Medication Sig Dispense Refill   amphetamine-dextroamphetamine (ADDERALL) 30 MG tablet Take 1 tablet by mouth 2 (two) times daily.     BD PEN NEEDLE NANO U/F 32G X 4 MM MISC USE AS DIRECTED ONCE DAILY WITH LEVEMIR AND 3 TIMES DAILY FOR HUMALOG  4   Blood Glucose Monitoring Suppl (CONTOUR NEXT MONITOR) w/Device KIT USE TO TEST BLOOD SUGAR BID  0   cholestyramine (QUESTRAN) 4 g packet Take 4 g by mouth daily.     clonazePAM (KLONOPIN) 1 MG  tablet Take 1 tablet (1 mg total) by mouth 3 (three) times daily as needed for anxiety. 30 tablet 2   colestipol (COLESTID) 1 g tablet Take 1 g by mouth daily.     CONTOUR NEXT TEST test strip   2   diclofenac sodium (VOLTAREN) 1 % GEL APPLY 4 GRAMS EXTERNALLY TO THE AFFECTED AREA TWICE DAILY 100 g 0   estradiol (VIVELLE-DOT) 0.05 MG/24HR patch USE UTD  11   glycopyrrolate (ROBINUL) 1 MG tablet Take 1 mg by mouth 2 (two) times daily.     halobetasol (ULTRAVATE) 0.05 % ointment APP AA BID  0   hyoscyamine (LEVBID) 0.375 MG 12  hr tablet Take 0.375 mg by mouth 2 (two) times daily.     insulin aspart (NOVOLOG FLEXPEN) 100 UNIT/ML FlexPen Inject 5 Units into the skin daily as needed for high blood sugar. PRN     LEVEMIR FLEXTOUCH 100 UNIT/ML Pen Inject 12 Units into the skin at bedtime.  12   meloxicam (MOBIC) 15 MG tablet TK 1 T PO QD PRN  4   methocarbamol (ROBAXIN) 500 MG tablet Take 1 tablet (500 mg total) by mouth 2 (two) times daily. 10 tablet 0   pantoprazole (PROTONIX) 20 MG tablet Take 20 mg by mouth daily.     progesterone (PROMETRIUM) 100 MG capsule Take 100 mg by mouth at bedtime.     Semaglutide,0.25 or 0.5MG /DOS, 2 MG/3ML SOPN Inject 0.5 mg into the skin once a week. 3 mL 2   simvastatin (ZOCOR) 20 MG tablet TK 1 T PO QPM  0   valACYclovir (VALTREX) 1000 MG tablet TK 2 TS PO NOW. REPEAT IN 12 H  3   VIIBRYD 40 MG TABS TK 1 T PO QD WITH A FULL MEAL 30 tablet 12   buPROPion (WELLBUTRIN XL) 150 MG 24 hr tablet Take 1 tablet (150 mg total) by mouth daily. 90 tablet 1   Continuous Blood Gluc Receiver (FREESTYLE LIBRE READER) DEVI U UTD  0   Continuous Blood Gluc Sensor (FREESTYLE LIBRE SENSOR SYSTEM) MISC APPLY ONE SENSOR TO THE BACK OF UPPER ARM Q 10 DAYS  5   No facility-administered medications prior to visit.   Allergies  Allergen Reactions   Codeine    Other Other (See Comments)     ROS: A complete ROS was performed with pertinent positives/negatives noted in the HPI. The remainder of the ROS are negative.    Objective:   Today's Vitals   05/24/23 1509  BP: 138/86  Pulse: 94  Temp: 97.8 F (36.6 C)  TempSrc: Temporal  SpO2: 98%  Weight: 132 lb 9.6 oz (60.1 kg)  Height: 5\' 4"  (1.626 m)    GENERAL: Well-appearing, in NAD. Well nourished.  SKIN: Pink, warm and dry.  RESPIRATORY: Chest wall symmetrical. Respirations even and non-labored.  PSYCH/MENTAL STATUS: Alert, oriented x 3. Cooperative, appropriate mood and affect.   There are no preventive care reminders to display for this  patient.  No results found for any visits on 05/24/23.      Assessment & Plan:  Assessment and Plan    Depression and Anxiety: Improved since restarting Viibryd 40mg  daily and Wellbutrin. Noted a decrease in mood after reducing Wellbutrin from 300mg  to 150mg . -Increase Wellbutrin back to 300mg  daily. - continue Viibryd 40mg      Return in about 3 months (around 08/24/2023) for Chronic Condition follow up.   Of note, portions of this note may have been created with voice recognition software (  Advertising account planner). While this note has been edited for accuracy, occasional wrong-word or 'sound-a-like' substitutions may have occurred due to the inherent limitations of voice recognition software.  Salvatore Decent, FNP

## 2023-05-25 ENCOUNTER — Telehealth: Payer: Self-pay | Admitting: Pharmacy Technician

## 2023-05-25 NOTE — Telephone Encounter (Signed)
Pharmacy Patient Advocate Encounter   Received notification from CoverMyMeds that prior authorization for Ozempic (0.25 or 0.5 MG/DOSE) 2MG /3ML pen-injectors is required/requested.   Insurance verification completed.   The patient is [place] through [facility] [place] .   Per test claim: PA submitted to [facility] [place] [employer] via CoverMyMeds Key/confirmation #/EOC [id number] Status is pending

## 2023-05-25 NOTE — ED Provider Notes (Cosign Needed)
[facility] AT [facility] Provider Note   CSN: [id number] Arrival date & time: 05/20/23  1823     History Chief Complaint  Patient presents with   Margaret Higgins    Margaret Higgins is a 57 y.o. female with history of osteoarthritis, diabetes presents emergency room today for evaluation after fall. Patient reports that she was leaning out of her bed to try to turn off a light however fell out of the bed on Wednesday, 2 days ago. She reports that she fell mainly onto her hand and left flank. She reports that her head was last to hit and she reports that very lightly hit the carpet and she does not have any head or neck pain. She denies any blood thinner use. She reports that she has been experiencing some left flank pain and some right hand pain. She reports pain hurts worse with deep breaths. She is not feeling any Spaete chest pain. No belly pain, nausea, vomiting. She did not have any loss of consciousness when she fell out of bed. She was noted. Reports with food. No pain medication taken prior to arrival. Presented to the emergency room and for further evaluation.    Fall       Home Medications Prior to Admission medications   Medication Sig Start Date End Date Taking? Authorizing Provider  methocarbamol (ROBAXIN) 500 MG tablet Take 1 tablet (500 mg total) by mouth 2 (two) times daily. 05/21/23  Yes Achille Rich, PA-C  amphetamine-dextroamphetamine (ADDERALL) 30 MG tablet Take 1 tablet by mouth 2 (two) times daily. 04/22/23   [provider]  BD PEN NEEDLE NANO U/F 32G X 4 MM MISC USE AS DIRECTED ONCE DAILY WITH LEVEMIR AND 3 TIMES DAILY FOR HUMALOG 05/03/17   [provider]  Blood Glucose Monitoring Suppl (CONTOUR NEXT MONITOR) w/Device KIT USE TO TEST BLOOD SUGAR BID 05/31/17   [provider]  buPROPion (WELLBUTRIN XL) 300 MG 24 hr tablet Take 1 tablet (300 mg total) by mouth daily. 05/24/23   Salvatore Decent, FNP  cholestyramine  Lanetta Inch) 4 g packet Take 4 g by mouth daily.    [provider]  clonazePAM (KLONOPIN) 1 MG tablet Take 1 tablet (1 mg total) by mouth 3 (three) times daily as needed for anxiety. 04/12/23   Salvatore Decent, FNP  colestipol (COLESTID) 1 g tablet Take 1 g by mouth daily.    [provider]  CONTOUR NEXT TEST test strip  07/06/17   [provider]  diclofenac sodium (VOLTAREN) 1 % GEL APPLY 4 GRAMS EXTERNALLY TO THE AFFECTED AREA TWICE DAILY 01/17/18   Cammy Copa, MD  estradiol (VIVELLE-DOT) 0.05 MG/24HR patch USE UTD 06/26/17   [provider]  glycopyrrolate (ROBINUL) 1 MG tablet Take 1 mg by mouth 2 (two) times daily.    [provider]  halobetasol (ULTRAVATE) 0.05 % ointment APP AA BID 10/18/17   [provider]  hyoscyamine (LEVBID) 0.375 MG 12 hr tablet Take 0.375 mg by mouth 2 (two) times daily. 04/01/23   [provider]  insulin aspart (NOVOLOG FLEXPEN) 100 UNIT/ML FlexPen Inject 5 Units into the skin daily as needed for high blood sugar. PRN    [provider]  LEVEMIR FLEXTOUCH 100 UNIT/ML Pen Inject 12 Units into the skin at bedtime. 07/05/17   [provider]  meloxicam (MOBIC) 15 MG tablet TK 1 T PO QD PRN 09/15/17   [provider]  pantoprazole (PROTONIX) 20 MG tablet Take  20 mg by mouth daily.    [provider]  progesterone (PROMETRIUM) 100 MG capsule Take 100 mg by mouth at bedtime.    [provider]  Semaglutide,0.25 or 0.5MG /DOS, 2 MG/3ML SOPN Inject 0.5 mg into the skin once a week. 05/18/23   Salvatore Decent, FNP  simvastatin (ZOCOR) 20 MG tablet TK 1 T PO QPM 06/09/17   [provider]  valACYclovir (VALTREX) 1000 MG tablet TK 2 TS PO NOW. REPEAT IN 12 H 10/04/17   [provider]  VIIBRYD 40 MG TABS TK 1 T PO QD WITH A FULL MEAL 04/25/23   Salvatore Decent, FNP      Allergies    Codeine and Other    Review of Systems     Physical Exam Updated Vital  Signs BP 132/77   Pulse 83   Temp 98 F (36.7 C) (Oral)   Resp 18   SpO2 93%  Physical Exam Vitals and nursing note reviewed.  Constitutional:      General: She is not in acute distress.    Appearance: She is not ill-appearing or toxic-appearing.  HENT:     Head: Normocephalic and atraumatic.     Comments: Nontender to palpation.  No step-offs or deformities.  No raccoon eyes or battle signs.    Right Ear: Tympanic membrane, ear canal and external ear normal.     Left Ear: Tympanic membrane, ear canal and external ear normal.     Nose: Nose normal.     Mouth/Throat:     Mouth: Mucous membranes are moist.  Eyes:     General: No scleral icterus.    Extraocular Movements: Extraocular movements intact.     Pupils: Pupils are equal, round, and reactive to light.  Neck:     Comments: Full range of motion without pain.  No step-offs or deformities.  Nontender to palpation.  No signs of trauma. Cardiovascular:     Rate and Rhythm: Normal rate.     Pulses: Normal pulses.  Abdominal:     Palpations: Abdomen is soft.     Tenderness: There is abdominal tenderness. There is no guarding or rebound.     Comments: Numbness to the more left lower chest/left upper abdomen on the flank.  There is some bruising noted.  No step-offs or deformities.  No flail chest noted.  Abdomen is soft.  No other tenderness.  Normal active bowel sounds.  No other signs of trauma.  Musculoskeletal:        General: Tenderness present.     Cervical back: Normal range of motion. No tenderness.     Comments: Does have some tenderness and bruising noted to her right thenar eminence.  Finger thumb opposition intact.  No snuffbox tenderness.  No other extremity both upper or lower tenderness to palpation.  No other signs of trauma.  Cap refill brisk in all 5 fingers on the right hand with palpable pulses.  Compartments soft throughout.  Skin:    General: Skin is warm and dry.     Capillary Refill: Capillary refill takes  less than 2 seconds.  Neurological:     General: No focal deficit present.     Mental Status: She is alert.     GCS: GCS eye subscore is 4. GCS verbal subscore is 5. GCS motor subscore is 6.     Cranial Nerves: No cranial nerve deficit, dysarthria or facial asymmetry.     Sensory: No sensory deficit.     Motor:  No weakness or pronator drift  ED Results / Procedures / Treatments   Labs (all labs ordered are listed, but only abnormal results are displayed) Labs Reviewed  COMPREHENSIVE METABOLIC PANEL - Abnormal; Notable for the following components:      Result Value   Glucose, Bld 109 (*)    All other components within normal limits  CBC WITH DIFFERENTIAL/PLATELET  TROPONIN I (HIGH SENSITIVITY)    EKG EKG Interpretation Date/Time:  Saturday May 21 2023 00:32:36 EDT Ventricular Rate:  75 PR Interval:  197 QRS Duration:  92 QT Interval:  394 QTC Calculation: 441 R Axis:   8  Text Interpretation: Sinus rhythm No significant change since last tracing Confirmed by Richardean Canal [phone]) on 05/23/2023 2:18:11 AM  Radiology No results found.  Procedures Procedures   Medications Ordered in ED Medications  iohexol (OMNIPAQUE) 300 MG/ML solution 100 mL (75 mLs Intravenous Contrast Given 05/20/23 2209)  ketorolac (TORADOL) 15 MG/ML injection 15 mg (15 mg Intramuscular Given 05/21/23 0042)    ED Course/ Medical Decision Making/ A&P                            Medical Decision Making Amount and/or Complexity of Data Reviewed Labs: ordered. Radiology: ordered.  Risk Prescription drug management.   57 y.o. female presents to the ER for evaluation of left flank pain and right hadn pain after fall. Differential diagnosis includes but is not limited to trauma. Vital signs unremarkable. Physical exam as noted above.    I independently reviewed and interpreted the patient's labs.  Troponin 3.  CMP shows glucose at 109 otherwise no electrolyte or LFT abnormality.  CBC without cytosis  or anemia.  CT chest abdomen pelvis with contrast shows  1. No acute/traumatic intrathoracic, abdominal, or pelvic pathology.  2. Sigmoid diverticulosis. No bowel obstruction.  3.  Aortic Atherosclerosis (ICD10-I70.0).   Right hand x-ray ordered however patient does not see it is needed given that she has full range of motion of her hand.  I discussed with her that this could be a hidden fracture Called a scaphoid fracture that we will need to be ruled out.  Patient still declines.  I discussed with her that scaphoid fractures are very important to catch and could be deadly including loss of limb however patient reports she is at this risk and still declines x-ray.  She reports me she will return if she changes her mind.    Pain likely rib contusion given no rib fractures seen on CT imaging.  No other intrathoracic or intra-abdominal trauma seen.  Will provide patient incentive spirometer to use at home.  Will also send her home on muscle laxer's.  Recommended Tylenol ibuprofen as needed for pain.  Patient reports good pain relief with the Toradol.  Recommended follow-up with primary care doctor.  We discussed the results of the labs/imaging. The plan is take medication as prescribed, follow-up with primary care doctor, return to the ER for hand x-ray if she changes her mind. We discussed strict return precautions and red flag symptoms. The patient verbalized their understanding and agrees to the plan. The patient is stable and being discharged home in good condition.   Portions of this report may have been transcribed using voice recognition software. Every effort was made to ensure accuracy; however, inadvertent computerized transcription errors may be present.    Final Clinical Impression(s) / ED Diagnoses Final diagnoses:  Fall, initial encounter  Contusion  of rib on left side, initial encounter  Right hand pain    Rx / DC Orders ED Discharge Orders          Ordered    methocarbamol  (ROBAXIN) 500 MG tablet  2 times daily        05/21/23 0038              Achille Rich, PA-C 05/25/23 2353

## 2023-05-30 ENCOUNTER — Ambulatory Visit: Payer: BC Managed Care – PPO | Admitting: Family

## 2023-06-02 DIAGNOSIS — Z419 Encounter for procedure for purposes other than remedying health state, unspecified: Secondary | ICD-10-CM | POA: Diagnosis not present

## 2023-06-03 ENCOUNTER — Ambulatory Visit (INDEPENDENT_AMBULATORY_CARE_PROVIDER_SITE_OTHER): Payer: Medicaid Other | Admitting: Physician Assistant

## 2023-06-03 ENCOUNTER — Other Ambulatory Visit (HOSPITAL_COMMUNITY): Payer: Self-pay

## 2023-06-03 ENCOUNTER — Encounter: Payer: Self-pay | Admitting: Physician Assistant

## 2023-06-03 VITALS — BP 118/70 | HR 88 | Temp 98.1°F | Resp 20 | Wt 131.0 lb

## 2023-06-03 DIAGNOSIS — R300 Dysuria: Secondary | ICD-10-CM | POA: Diagnosis not present

## 2023-06-03 DIAGNOSIS — N3001 Acute cystitis with hematuria: Secondary | ICD-10-CM

## 2023-06-03 LAB — POCT URINALYSIS DIP (MANUAL ENTRY)
Bilirubin, UA: NEGATIVE
Glucose, UA: NEGATIVE mg/dL
Nitrite, UA: NEGATIVE
Protein Ur, POC: 30 mg/dL — AB
Spec Grav, UA: 1.025 (ref 1.010–1.025)
Urobilinogen, UA: 0.2 E.U./dL
pH, UA: 6 (ref 5.0–8.0)

## 2023-06-03 MED ORDER — CEPHALEXIN 500 MG PO CAPS: 500.0000 mg | ORAL_CAPSULE | Freq: Two times a day (BID) | ORAL | 0 refills | Status: DC

## 2023-06-03 NOTE — Progress Notes (Signed)
Established patient visit   Patient: Margaret Higgins   DOB: 08-Aug-1966   57 y.o. Female  MRN: [id number] Visit Date: 06/03/2023  Today's healthcare provider: Alfredia Ferguson, PA-C   Chief Complaint  Patient presents with   Back Pain    Left side back pain that radiates down right thigh    Odor with urination   Subjective    HPI  Pt was traveling last week, reports being more active than usual. Starting Tuesday, x 3 days ago, with left sided lower back pain, radiating down to knee. Using heat. This morning, urine smelled strong, some dysuria.  Denies lower abdominal pain , fevers, hematuria.  Medications: Outpatient Medications Prior to Visit  Medication Sig   amphetamine-dextroamphetamine (ADDERALL) 30 MG tablet Take 1 tablet by mouth 2 (two) times daily.   BD PEN NEEDLE NANO U/F 32G X 4 MM MISC USE AS DIRECTED ONCE DAILY WITH LEVEMIR AND 3 TIMES DAILY FOR HUMALOG   Blood Glucose Monitoring Suppl (CONTOUR NEXT MONITOR) w/Device KIT USE TO TEST BLOOD SUGAR BID   buPROPion (WELLBUTRIN XL) 300 MG 24 hr tablet Take 1 tablet (300 mg total) by mouth daily.   cholestyramine (QUESTRAN) 4 g packet Take 4 g by mouth daily.   clonazePAM (KLONOPIN) 1 MG tablet Take 1 tablet (1 mg total) by mouth 3 (three) times daily as needed for anxiety.   colestipol (COLESTID) 1 g tablet Take 1 g by mouth daily.   CONTOUR NEXT TEST test strip    diclofenac sodium (VOLTAREN) 1 % GEL APPLY 4 GRAMS EXTERNALLY TO THE AFFECTED AREA TWICE DAILY   estradiol (VIVELLE-DOT) 0.05 MG/24HR patch USE UTD   glycopyrrolate (ROBINUL) 1 MG tablet Take 1 mg by mouth 2 (two) times daily.   halobetasol (ULTRAVATE) 0.05 % ointment APP AA BID   hyoscyamine (LEVBID) 0.375 MG 12 hr tablet Take 0.375 mg by mouth 2 (two) times daily.   insulin aspart (NOVOLOG FLEXPEN) 100 UNIT/ML FlexPen Inject 5 Units into the skin daily as needed for high blood sugar. PRN   LEVEMIR FLEXTOUCH 100 UNIT/ML Pen Inject 12 Units into the skin  at bedtime.   meloxicam (MOBIC) 15 MG tablet TK 1 T PO QD PRN   methocarbamol (ROBAXIN) 500 MG tablet Take 1 tablet (500 mg total) by mouth 2 (two) times daily.   pantoprazole (PROTONIX) 20 MG tablet Take 20 mg by mouth daily.   progesterone (PROMETRIUM) 100 MG capsule Take 100 mg by mouth at bedtime.   Semaglutide,0.25 or 0.5MG /DOS, 2 MG/3ML SOPN Inject 0.5 mg into the skin once a week.   simvastatin (ZOCOR) 20 MG tablet TK 1 T PO QPM   valACYclovir (VALTREX) 1000 MG tablet TK 2 TS PO NOW. REPEAT IN 12 H   VIIBRYD 40 MG TABS TK 1 T PO QD WITH A FULL MEAL   No facility-administered medications prior to visit.    Review of Systems  Constitutional:  Negative for fatigue and fever.  Respiratory:  Negative for cough and shortness of breath.   Cardiovascular:  Negative for chest pain and leg swelling.  Gastrointestinal:  Negative for abdominal pain.  Genitourinary:  Positive for dysuria.  Musculoskeletal:  Positive for back pain.  Neurological:  Negative for dizziness and headaches.       Objective    BP 118/70 (BP Location: Left Arm, Patient Position: Sitting, Cuff Size: Normal)   Pulse 88   Temp 98.1 F (36.7 C) (Oral)   Resp 20  Wt 131 lb (59.4 kg)   SpO2 99%   BMI 22.49 kg/m    Physical Exam Vitals reviewed.  Constitutional:      Appearance: She is not ill-appearing.  HENT:     Head: Normocephalic.  Eyes:     Conjunctiva/sclera: Conjunctivae normal.  Cardiovascular:     Rate and Rhythm: Normal rate.  Pulmonary:     Effort: Pulmonary effort is normal. No respiratory distress.  Abdominal:     Palpations: Abdomen is soft.     Tenderness: There is no abdominal tenderness. There is no left CVA tenderness, guarding or rebound.  Neurological:     General: No focal deficit present.     Mental Status: She is alert and oriented to person, place, and time.  Psychiatric:        Mood and Affect: Mood normal.        Behavior: Behavior normal.      Results for orders  placed or performed in visit on 06/03/23  POCT urinalysis dipstick  Result Value Ref Range   Color, UA other (A) yellow   Clarity, UA cloudy (A) clear   Glucose, UA negative negative mg/dL   Bilirubin, UA negative negative   Ketones, POC UA trace (5) (A) negative mg/dL   Spec Grav, UA 6.962 9.528 - 1.025   Blood, UA moderate (A) negative   pH, UA 6.0 5.0 - 8.0   Protein Ur, POC =30 (A) negative mg/dL   Urobilinogen, UA 0.2 0.2 or 1.0 E.U./dL   Nitrite, UA Negative Negative   Leukocytes, UA Large (3+) (A) Negative    Assessment & Plan     1. Acute cystitis with hematuria 2. Dysuria UA + leuk, blood Rx keflex bid x 5 days   Lbp may be 2/2 UTI or a lumbar strain Rec stretching, tylenol, heat.   -Urine culture - POCT urinalysis dipstick   Return if symptoms worsen or fail to improve.      I, Alfredia Ferguson, PA-C have reviewed all documentation for this visit. The documentation on  06/03/23   for the exam, diagnosis, procedures, and orders are all accurate and complete.    Alfredia Ferguson, PA-C  [facility] [phone] (phone) [phone] (fax)  [facility]

## 2023-06-03 NOTE — Telephone Encounter (Signed)
Pharmacy Patient Advocate Encounter  Received notification from [employer] Medicaid that Prior Authorization for Ozempic (0.25 or 0.5 MG/DOSE) 2MG /3ML pen-injectors  has been APPROVED from 05/25/2023 to 05/24/2024. Ran test claim, Copay is $4.00  PA #/Case ID/Reference #: [id number]

## 2023-06-06 ENCOUNTER — Telehealth: Payer: Self-pay | Admitting: Internal Medicine

## 2023-06-06 NOTE — Telephone Encounter (Signed)
Pt said the Keflex she was prescribed has caused her to get a yeast infection as of this morning. Pt would like to know if something can be sent in to [facility] on [address]

## 2023-06-06 NOTE — Telephone Encounter (Signed)
Pt was seen for a UTI on Friday has finished her antibiotic and now has a yeast infection. Can you prescribe something or should she  call the person she saw at [facility]?

## 2023-06-06 NOTE — Telephone Encounter (Addendum)
Patient was informed that she needs to contact the provider who treated her UTI

## 2023-06-07 ENCOUNTER — Telehealth: Payer: Self-pay | Admitting: Internal Medicine

## 2023-06-07 ENCOUNTER — Other Ambulatory Visit: Payer: Self-pay | Admitting: Physician Assistant

## 2023-06-07 DIAGNOSIS — B379 Candidiasis, unspecified: Secondary | ICD-10-CM

## 2023-06-07 MED ORDER — FLUCONAZOLE 150 MG PO TABS
150.0000 mg | ORAL_TABLET | Freq: Once | ORAL | 0 refills | Status: AC
Start: 2023-06-07 — End: 2023-06-07

## 2023-06-07 NOTE — Telephone Encounter (Signed)
Pt called stating that she is still having issues with the UTI and wanted to see if Lillia Abed had any suggestions. Please Advise.

## 2023-06-07 NOTE — Telephone Encounter (Signed)
Called patient she states she is still having symptoms of uti. Her pee is still cloudy and has a smell, an antibiotic was sent in today per Alfredia Ferguson, PA for possible yeast infection. Patient was informed that since she just picked up rx today it may take up to three day for results of medication and she will need to contact her pcp if symptoms continue.

## 2023-07-03 DIAGNOSIS — Z419 Encounter for procedure for purposes other than remedying health state, unspecified: Secondary | ICD-10-CM | POA: Diagnosis not present

## 2023-07-27 ENCOUNTER — Encounter: Payer: Self-pay | Admitting: Internal Medicine

## 2023-07-27 ENCOUNTER — Ambulatory Visit: Payer: Medicaid Other | Admitting: Internal Medicine

## 2023-07-27 VITALS — BP 110/60 | HR 84 | Temp 98.0°F | Ht 64.0 in | Wt 136.2 lb

## 2023-07-27 DIAGNOSIS — M7989 Other specified soft tissue disorders: Secondary | ICD-10-CM

## 2023-07-27 LAB — CBC WITH DIFFERENTIAL/PLATELET
Basophils Absolute: 0 10*3/uL (ref 0.0–0.1)
Basophils Relative: 0.7 % (ref 0.0–3.0)
Eosinophils Absolute: 0.1 10*3/uL (ref 0.0–0.7)
Eosinophils Relative: 1.4 % (ref 0.0–5.0)
HCT: 39.5 % (ref 36.0–46.0)
Hemoglobin: 13.3 g/dL (ref 12.0–15.0)
Lymphocytes Relative: 31.2 % (ref 12.0–46.0)
Lymphs Abs: 1.6 10*3/uL (ref 0.7–4.0)
MCHC: 33.7 g/dL (ref 30.0–36.0)
MCV: 95.7 fl (ref 78.0–100.0)
Monocytes Absolute: 0.2 10*3/uL (ref 0.1–1.0)
Monocytes Relative: 4.4 % (ref 3.0–12.0)
Neutro Abs: 3.3 10*3/uL (ref 1.4–7.7)
Neutrophils Relative %: 62.3 % (ref 43.0–77.0)
Platelets: 207 10*3/uL (ref 150.0–400.0)
RBC: 4.12 Mil/uL (ref 3.87–5.11)
RDW: 12.8 % (ref 11.5–15.5)
WBC: 5.3 10*3/uL (ref 4.0–10.5)

## 2023-07-27 NOTE — Progress Notes (Signed)
[facility] [address] [place] [address] Dept: [phone] Dept Fax: [phone]  Acute Care Office Visit  Subjective:   Margaret Higgins April 16, 1966 07/27/2023  Chief Complaint  Patient presents with   Foot Swelling    Started this morning    HPI: Margaret Higgins is a 57 yo F with PMHx T2DM with neuropathy, who complains of R. Foot swelling onset today. Patient states she woke up this morning and noticed swelling and bluish discoloration to right foot. No recent injury. She does state that she dropped a can of spaghetti sauce on her foot in July 2024, but no further injuries. Associated numbness which has been ongoing for several months.  Denies fever, chills, CP, SHOB, palpitations, dizziness, syncope.   The following portions of the patient's history were reviewed and updated as appropriate: past medical history, past surgical history, family history, social history, allergies, medications, and problem list.   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 Family History  Problem Relation Age of Onset   Heart attack Mother    Parkinson's disease Mother    CAD Mother    Alzheimer's disease Father    Diabetes Father    Hypertension Father     Current  Outpatient Medications:    amphetamine-dextroamphetamine (ADDERALL) 30 MG tablet, Take 1 tablet by mouth 2 (two) times daily., Disp: , Rfl:    BD PEN NEEDLE NANO U/F 32G X 4 MM MISC, USE AS DIRECTED ONCE DAILY WITH LEVEMIR AND 3 TIMES DAILY FOR HUMALOG, Disp: , Rfl: 4   Blood Glucose Monitoring Suppl (CONTOUR NEXT MONITOR) w/Device KIT, USE TO TEST BLOOD SUGAR BID, Disp: , Rfl: 0   buPROPion (WELLBUTRIN XL) 300 MG 24 hr tablet, Take 1 tablet (300 mg total) by mouth daily., Disp: 90 tablet, Rfl: 3   clonazePAM (KLONOPIN) 1 MG tablet, Take 1 tablet (1 mg total) by mouth 3 (three) times daily as needed for anxiety., Disp: 30 tablet, Rfl: 2   CONTOUR NEXT TEST test strip, , Disp: , Rfl: 2   diclofenac sodium (VOLTAREN) 1 % GEL, APPLY 4 GRAMS EXTERNALLY TO THE AFFECTED AREA TWICE DAILY, Disp: 100 g, Rfl: 0   estradiol (VIVELLE-DOT) 0.05 MG/24HR patch, USE UTD, Disp: , Rfl: 11   glycopyrrolate (ROBINUL) 1 MG tablet, Take 1 mg by mouth 2 (two) times daily., Disp: , Rfl:    hyoscyamine (LEVBID) 0.375 MG 12 hr tablet, Take 0.375 mg by mouth 2 (two) times daily., Disp: , Rfl:    insulin aspart (NOVOLOG FLEXPEN) 100 UNIT/ML FlexPen, Inject 5 Units into the skin daily as needed for high blood sugar. PRN, Disp: , Rfl:    LEVEMIR FLEXTOUCH 100 UNIT/ML Pen, Inject 12 Units into the skin at bedtime., Disp: , Rfl: 12   meloxicam (MOBIC) 15 MG tablet, TK 1 T  PO QD PRN, Disp: , Rfl: 4   pantoprazole (PROTONIX) 20 MG tablet, Take 20 mg by mouth daily., Disp: , Rfl:    progesterone (PROMETRIUM) 100 MG capsule, Take 100 mg by mouth at bedtime., Disp: , Rfl:    Semaglutide,0.25 or 0.5MG /DOS, 2 MG/3ML SOPN, Inject 0.5 mg into the skin once a week., Disp: 3 mL, Rfl: 2   simvastatin (ZOCOR) 20 MG tablet, TK 1 T PO QPM, Disp: , Rfl: 0   VIIBRYD 40 MG TABS, TK 1 T PO QD WITH A FULL MEAL, Disp: 30 tablet, Rfl: 12   cholestyramine (QUESTRAN) 4 g packet, Take 4 g by mouth daily., Disp: , Rfl:    colestipol (COLESTID) 1 g  tablet, Take 1 g by mouth., Disp: , Rfl:    halobetasol (ULTRAVATE) 0.05 % ointment, APP AA BID (Patient not taking: Reported on 07/27/2023), Disp: , Rfl: 0   methocarbamol (ROBAXIN) 500 MG tablet, Take 1 tablet (500 mg total) by mouth 2 (two) times daily. (Patient not taking: Reported on 07/27/2023), Disp: 10 tablet, Rfl: 0   valACYclovir (VALTREX) 1000 MG tablet, TK 2 TS PO NOW. REPEAT IN 12 H (Patient not taking: Reported on 07/27/2023), Disp: , Rfl: 3 Allergies  Allergen Reactions   Codeine    Other Other (See Comments)     ROS: A complete ROS was performed with pertinent positives/negatives noted in the HPI. The remainder of the ROS are negative.    Objective:   Today's Vitals   07/27/23 1433  BP: 110/60  Pulse: 84  Temp: 98 F (36.7 C)  TempSrc: Temporal  SpO2: 98%  Weight: 136 lb 3.2 oz (61.8 kg)  Height: 5\' 4"  (1.626 m)    GENERAL: Well-appearing, in NAD. Well nourished.  SKIN: Pink, warm and dry. Pale- blue discoloration & ecchymosis appearance to dorsal surface of left foot.  RESPIRATORY: Chest wall symmetrical. Respirations even and non-labored.  CARDIAC: Peripheral pulses 2+ bilaterally.  EXTREMITIES: Without clubbing, cyanosis. 2+ edema limited to dorsal surface of left foot  NEUROLOGIC: No motor or sensory deficits. Steady, even gait.  PSYCH/MENTAL STATUS: Alert, oriented x 3. Cooperative, appropriate mood and affect.    No results found for any visits on 07/27/23.    Assessment & Plan:   Foot swelling -     VAS [place] LOWER EXTREMITY VENOUS (DVT); Future -     D-dimer, quantitative -     Basic metabolic panel -     CBC with Differential/Platelet -     DG Foot Complete Left; Future   Orders Placed This Encounter  Procedures   DG Foot Complete Left    Standing Status:   Future    Standing Expiration Date:   07/26/2024    Order Specific Question:   Preferred imaging location?    Answer:   [facility]    Order Specific Question:   Reason for exam:     Answer:   swelling and eccymosis of left foot    Order Specific Question:   Release to patient    Answer:   Immediate   D-Dimer, Quantitative   Basic Metabolic Panel (BMET)   CBC w/Diff   Lab Orders         D-Dimer, Quantitative         Basic Metabolic Panel (BMET)         CBC w/Diff     No images are attached to the encounter or orders placed in the encounter.  Return in about 2  months (around 09/26/2023) for Chronic Condition follow up, and as needed .   Salvatore Decent, FNP

## 2023-07-28 ENCOUNTER — Ambulatory Visit: Payer: Medicaid Other | Admitting: Internal Medicine

## 2023-07-28 LAB — BASIC METABOLIC PANEL
BUN: 22 mg/dL (ref 6–23)
CO2: 27 mEq/L (ref 19–32)
Calcium: 9 mg/dL (ref 8.4–10.5)
Chloride: 103 mEq/L (ref 96–112)
Creatinine, Ser: 0.77 mg/dL (ref 0.40–1.20)
GFR: 84.54 mL/min (ref >60.00)
Glucose, Bld: 212 mg/dL — ABNORMAL HIGH (ref 70–99)
Potassium: 4.2 mEq/L (ref 3.5–5.1)
Sodium: 135 mEq/L (ref 135–145)

## 2023-07-28 LAB — D-DIMER, QUANTITATIVE: D-Dimer, Quant: 0.46 mcg/mL FEU (ref <0.50)

## 2023-08-02 DIAGNOSIS — Z419 Encounter for procedure for purposes other than remedying health state, unspecified: Secondary | ICD-10-CM | POA: Diagnosis not present

## 2023-08-04 ENCOUNTER — Other Ambulatory Visit: Payer: Self-pay | Admitting: Internal Medicine

## 2023-08-04 DIAGNOSIS — F321 Major depressive disorder, single episode, moderate: Secondary | ICD-10-CM

## 2023-08-10 ENCOUNTER — Telehealth: Payer: Self-pay

## 2023-08-10 ENCOUNTER — Other Ambulatory Visit (HOSPITAL_COMMUNITY): Payer: Self-pay

## 2023-08-10 NOTE — Telephone Encounter (Signed)
Pharmacy is requesting PA for Rx VIIBRYD 40 MG TABS

## 2023-08-10 NOTE — Telephone Encounter (Signed)
Pharmacy Patient Advocate Encounter   Received notification from Pt Calls Messages that prior authorization for Viibryd 40mg  is required/requested.   Insurance verification completed.   The patient is insured through [facility] [place] .   Per test claim: PA required; PA submitted to [facility] [place] Medicaid via CoverMyMeds Key/confirmation #/EOC [id number] Status is pending

## 2023-08-11 ENCOUNTER — Other Ambulatory Visit (HOSPITAL_COMMUNITY): Payer: Self-pay

## 2023-08-11 NOTE — Telephone Encounter (Signed)
Pharmacy Patient Advocate Encounter  Received notification from [facility] Medicaid that Prior Authorization for Vilazodone 40mg  (Viibryd) has been APPROVED from 08/10/23 to 11/01/23   PA #/Case ID/Reference #: [id number]     The generic is approved and on the Preferred Drug list.

## 2023-09-02 DIAGNOSIS — Z419 Encounter for procedure for purposes other than remedying health state, unspecified: Secondary | ICD-10-CM | POA: Diagnosis not present

## 2023-09-04 ENCOUNTER — Other Ambulatory Visit: Payer: Self-pay | Admitting: Internal Medicine

## 2023-09-04 DIAGNOSIS — E1165 Type 2 diabetes mellitus with hyperglycemia: Secondary | ICD-10-CM

## 2023-09-27 ENCOUNTER — Encounter: Payer: Self-pay | Admitting: Internal Medicine

## 2023-09-27 ENCOUNTER — Other Ambulatory Visit (HOSPITAL_COMMUNITY)
Admission: RE | Admit: 2023-09-27 | Discharge: 2023-09-27 | Disposition: A | Discharge status: Home / Self Care | Payer: Medicaid Other | Source: Ambulatory Visit | Attending: Internal Medicine | Admitting: Internal Medicine

## 2023-09-27 ENCOUNTER — Ambulatory Visit (INDEPENDENT_AMBULATORY_CARE_PROVIDER_SITE_OTHER): Payer: Medicaid Other | Admitting: Internal Medicine

## 2023-09-27 VITALS — BP 116/74 | HR 82 | Temp 97.6°F | Ht 64.0 in | Wt 138.8 lb

## 2023-09-27 DIAGNOSIS — E785 Hyperlipidemia, unspecified: Secondary | ICD-10-CM

## 2023-09-27 DIAGNOSIS — Z780 Asymptomatic menopausal state: Secondary | ICD-10-CM | POA: Diagnosis not present

## 2023-09-27 DIAGNOSIS — K589 Irritable bowel syndrome without diarrhea: Secondary | ICD-10-CM | POA: Diagnosis not present

## 2023-09-27 DIAGNOSIS — Z794 Long term (current) use of insulin: Secondary | ICD-10-CM

## 2023-09-27 DIAGNOSIS — R300 Dysuria: Secondary | ICD-10-CM | POA: Diagnosis not present

## 2023-09-27 DIAGNOSIS — E1165 Type 2 diabetes mellitus with hyperglycemia: Secondary | ICD-10-CM

## 2023-09-27 DIAGNOSIS — M150 Primary generalized (osteo)arthritis: Secondary | ICD-10-CM

## 2023-09-27 DIAGNOSIS — Z23 Encounter for immunization: Secondary | ICD-10-CM | POA: Diagnosis not present

## 2023-09-27 DIAGNOSIS — F419 Anxiety disorder, unspecified: Secondary | ICD-10-CM

## 2023-09-27 DIAGNOSIS — F321 Major depressive disorder, single episode, moderate: Secondary | ICD-10-CM

## 2023-09-27 LAB — POC URINALSYSI DIPSTICK (AUTOMATED)
Bilirubin, UA: NEGATIVE
Blood, UA: NEGATIVE
Glucose, UA: POSITIVE — AB
Ketones, UA: NEGATIVE
Leukocytes, UA: NEGATIVE
Nitrite, UA: NEGATIVE
Protein, UA: NEGATIVE
Spec Grav, UA: 1.025 (ref 1.010–1.025)
Urobilinogen, UA: 0.2 U/dL
pH, UA: 6 (ref 5.0–8.0)

## 2023-09-27 MED ORDER — MELOXICAM 15 MG PO TABS
15.0000 mg | ORAL_TABLET | Freq: Every day | ORAL | 2 refills | Status: DC | PRN
Start: 2023-09-27 — End: 2024-01-09

## 2023-09-27 NOTE — Progress Notes (Signed)
[facility] [address] [place] [address] Dept: [phone] Dept Fax: [phone]    Subjective:   Margaret Higgins [birth year] 09/27/2023  Chief Complaint  Patient presents with   Follow-up   Urinary Tract Infection    HPI: Margaret Higgins presents today for re-assessment and management of chronic medical conditions. Discussed the use of AI scribe software for clinical note transcription with the patient, who gave verbal consent to proceed.  History of Present Illness   The patient, with a history of diabetes, HLD, anxiety, depression, OA, and irritable bowel syndrome (IBS), presents for a routine follow-up. They report good control of their diabetes with Novolog insulin 5 units PRN and semaglutide (Ozempic) once weekly, and their last A1c in June 2024 was 5.8. They check their blood sugars irregularly, only when they suspect their blood sugar is high.  Their IBS is managed with colestipol, cholestyramine, Robinul, and Levbid, prescribed by their GI specialist. They report that these medications keep their IBS under control for the most part, with occasional flare-ups.  The patient also reports a recent onset of urinary symptoms, suspecting another urinary tract infection (UTI). They describe their urine as cloudy and experience a burning sensation when urinating. They had a UTI earlier in the summer. She is sexually active with 1 female partner. Denies vaginal itching or discharge.   They also have arthritis, managed with meloxicam as needed, primarily for their right shoulder and right index finger. Needs Meloxicam refill.   The patient is also on hormone replacement therapy, which they have been on for seven years for VMS due to menopause. They see an OBGYN for this.   They are also on Viibryd and Wellbutrin for depression, and Klonopin as needed. They report a recent emotional stressor with a family loss, which has  affected their mood. Coping well.    Mammogram scheduled for February.   They had a colonoscopy in July 2020 and are due for another one next year.        The following portions of the patient's history were reviewed and updated as appropriate: past medical history, past surgical history, family history, social history, allergies, medications, and problem list.   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 No pertinent surgical history. Family History  Problem Relation Age of Onset   Heart attack Mother    Parkinson's disease Mother    CAD Mother    Alzheimer's disease Father    Diabetes Father    Hypertension Father     Current Outpatient Medications:    amphetamine-dextroamphetamine (ADDERALL) 30 MG tablet, Take 1 tablet by mouth 2 (two) times daily., Disp: , Rfl:    BD PEN NEEDLE NANO U/F 32G X 4 MM MISC, USE AS DIRECTED ONCE DAILY WITH LEVEMIR AND 3 TIMES DAILY FOR HUMALOG, Disp: , Rfl: 4   Blood  Glucose Monitoring Suppl (CONTOUR NEXT MONITOR) w/Device KIT, USE TO TEST BLOOD SUGAR BID, Disp: , Rfl: 0   buPROPion (WELLBUTRIN XL) 300 MG 24 hr tablet, Take 1 tablet (300 mg total) by mouth daily., Disp: 90 tablet, Rfl: 3   cholestyramine (QUESTRAN) 4 g packet, Take 4 g by mouth daily., Disp: , Rfl:    clonazePAM (KLONOPIN) 1 MG tablet, TAKE  1 TABLET BY MOUTH 3 TIMES DAILY AS NEEDED FOR ANXIETY., Disp: 30 tablet, Rfl: 1   colestipol (COLESTID) 1 g tablet, Take 1 g by mouth., Disp: , Rfl:    CONTOUR NEXT TEST test strip, , Disp: , Rfl: 2   diclofenac sodium (VOLTAREN) 1 % GEL, APPLY 4 GRAMS EXTERNALLY TO THE AFFECTED AREA TWICE DAILY, Disp: 100 g, Rfl: 0   estradiol (VIVELLE-DOT) 0.05 MG/24HR patch, USE UTD, Disp: , Rfl: 11   glycopyrrolate (ROBINUL) 1 MG tablet, Take 1 mg by mouth 2 (two) times daily., Disp: , Rfl:    halobetasol (ULTRAVATE) 0.05 % ointment, , Disp: , Rfl: 0   hyoscyamine (LEVBID) 0.375 MG 12 hr tablet, Take 0.375 mg by mouth 2 (two) times daily., Disp: , Rfl:    insulin aspart (NOVOLOG FLEXPEN) 100 UNIT/ML FlexPen, Inject 5 Units into the skin daily as needed for high blood sugar. PRN, Disp: , Rfl:    LEVEMIR FLEXTOUCH 100 UNIT/ML Pen, Inject 12 Units into the skin at bedtime., Disp: , Rfl: 12   pantoprazole (PROTONIX) 20 MG tablet, Take 20 mg by mouth daily., Disp: , Rfl:    progesterone (PROMETRIUM) 100 MG capsule, Take 100 mg by mouth at bedtime., Disp: , Rfl:    Semaglutide,0.25 or 0.5MG /DOS, (OZEMPIC, 0.25 OR 0.5 MG/DOSE,) 2 MG/3ML SOPN, INJECT 0.5 MG INTO THE SKIN ONE TIME PER WEEK, Disp: 3 mL, Rfl: 2   simvastatin (ZOCOR) 20 MG tablet, TK 1 T PO QPM, Disp: , Rfl: 0   valACYclovir (VALTREX) 1000 MG tablet, , Disp: , Rfl: 3   VIIBRYD 40 MG TABS, TK 1 T PO QD WITH A FULL MEAL, Disp: 30 tablet, Rfl: 12   meloxicam (MOBIC) 15 MG tablet, Take 1 tablet (15 mg total) by mouth daily as needed for pain., Disp: 30 tablet, Rfl: 2 Allergies  Allergen Reactions   Codeine    Other Other (See Comments)     ROS: A complete ROS was performed with pertinent positives/negatives noted in the HPI. The remainder of the ROS are negative.    Objective:   Today's Vitals   09/27/23 1529  BP: 116/74  Pulse: 82  Temp: 97.6 F (36.4 C)  TempSrc: Temporal  SpO2: 99%  Weight: 138 lb 12.8 oz (63 kg)  Height: 5\' 4"  (1.626 m)     GENERAL: Well-appearing, in NAD. Well nourished.  SKIN: Pink, warm and dry. No rash.  NECK: Trachea midline. Full ROM w/o pain or tenderness. No lymphadenopathy.  RESPIRATORY: Chest wall symmetrical. Respirations even and non-labored. Breath sounds clear to auscultation bilaterally.  CARDIAC: S1, S2 present, regular rate and rhythm. Peripheral pulses 2+ bilaterally.  EXTREMITIES: Without clubbing, cyanosis, or edema.  NEUROLOGIC: Steady, even gait.  PSYCH/MENTAL STATUS: Alert, oriented x 3. Cooperative, appropriate mood and affect.   Health Maintenance Due  Topic Date Due   COVID-19 Vaccine (1 - 2023-24 season) Never done    No results found for any visits on 09/27/23.  The ASCVD Risk score (Arnett DK, et al., 2019) failed to calculate for the following reasons:   Cannot find a  previous HDL lab   Cannot find a previous total cholesterol lab     Assessment & Plan:  Assessment and Plan    Type 2 Diabetes Mellitus Well controlled on Novolog 5 units as needed and Ozempic 0.5mg  weekly. Last A1c was 5.8 in June. -Check A1c today.  Hyperlipidemia On Simvastatin 20mg  daily. -Check lipid panel today.  Irritable Bowel Syndrome Managed by GI specialist. On Colestipol, Cholestyramine, Robinul, and Hyoscyamine. Reports occasional flare-ups but overall control is good.  Depression and Anxiety Recent stressor with family loss. Currently on Viibryd and Wellbutrin, with Klonopin as needed. -Continue current regimen.  Arthritis Occasional use of Meloxicam for right shoulder and finger pain. -Refill Meloxicam prescription.  Postmenopausal Hormone Replacement Therapy On therapy for 7 years. Follow-up with OB/GYN for management. -instructed patient to discuss this with OB/GYN about potential need to wean off therapy.  Possible Urinary Tract Infection Reports recent onset of dysuria and cloudy urine. -Collect urine sample for analysis today. No UTI present on specimen.  - collect  vaginal swab to check for BV, yeast, GC/CH   General Health Maintenance -Scheduled for mammogram in February. -Colonoscopy due in July 2025. -Administer flu shot today.       Orders Placed This Encounter  Procedures   Flu vaccine trivalent PF, 6mos and older(Flulaval,Afluria,Fluarix,Fluzone)   Comprehensive metabolic panel   Lipid panel   TSH   Hemoglobin A1C   No images are attached to the encounter or orders placed in the encounter. Meds ordered this encounter  Medications   meloxicam (MOBIC) 15 MG tablet    Sig: Take 1 tablet (15 mg total) by mouth daily as needed for pain.    Dispense:  30 tablet    Refill:  2    Order Specific Question:   Supervising Provider    Answer:   Garnette Gunner [[id number]]    Return in about 6 months (around 03/26/2024) for Chronic Condition follow up.   Salvatore Decent, FNP

## 2023-09-28 LAB — COMPREHENSIVE METABOLIC PANEL
ALT: 11 U/L (ref 0–35)
AST: 12 U/L (ref 0–37)
Albumin: 4 g/dL (ref 3.5–5.2)
Alkaline Phosphatase: 68 U/L (ref 39–117)
BUN: 15 mg/dL (ref 6–23)
CO2: 26 meq/L (ref 19–32)
Calcium: 9.2 mg/dL (ref 8.4–10.5)
Chloride: 105 meq/L (ref 96–112)
Creatinine, Ser: 0.77 mg/dL (ref 0.40–1.20)
GFR: 84.44 mL/min (ref >60.00)
Glucose, Bld: 248 mg/dL — ABNORMAL HIGH (ref 70–99)
Potassium: 3.9 meq/L (ref 3.5–5.1)
Sodium: 137 meq/L (ref 135–145)
Total Bilirubin: 0.3 mg/dL (ref 0.2–1.2)
Total Protein: 6.7 g/dL (ref 6.0–8.3)

## 2023-09-28 LAB — HEMOGLOBIN A1C: Hgb A1c MFr Bld: 6.3 % (ref 4.6–6.5)

## 2023-09-28 LAB — LIPID PANEL
Cholesterol: 137 mg/dL (ref 0–200)
HDL: 45.8 mg/dL (ref >39.00)
LDL Cholesterol: 56 mg/dL (ref 0–99)
NonHDL: 91.37
Total CHOL/HDL Ratio: 3
Triglycerides: 177 mg/dL — ABNORMAL HIGH (ref 0.0–149.0)
VLDL: 35.4 mg/dL (ref 0.0–40.0)

## 2023-09-28 LAB — TSH: TSH: 0.69 u[IU]/mL (ref 0.35–5.50)

## 2023-09-30 LAB — CERVICOVAGINAL ANCILLARY ONLY
Comment: NEGATIVE
Comment: NEGATIVE
Comment: NEGATIVE
Comment: NEGATIVE
Comment: NEGATIVE
Comment: NORMAL

## 2023-10-02 DIAGNOSIS — Z419 Encounter for procedure for purposes other than remedying health state, unspecified: Secondary | ICD-10-CM | POA: Diagnosis not present

## 2023-10-06 ENCOUNTER — Ambulatory Visit: Payer: Medicaid Other | Admitting: Internal Medicine

## 2023-10-06 ENCOUNTER — Telehealth: Payer: Self-pay | Admitting: Internal Medicine

## 2023-10-06 NOTE — Progress Notes (Signed)
[facility] [address] [place] [address] Dept: [phone] Dept Fax: [phone]  Acute Care Office Visit  Subjective:   Margaret Higgins [birth year] 10/07/2023  Chief Complaint  Patient presents with   Sinus Problem   Headache    Started 2 days ago    HPI: Discussed the use of AI scribe software for clinical note transcription with the patient, who gave verbal consent to proceed.  History of Present Illness   The patient presents with a two-day history of sinus congestion and a persistent, dull headache located 'in between my eyeballs.' She has tried Advil without relief. She denies a runny nose, but reports a 'stopped up' sensation in her nose and a scratchy throat. no fever, chills, CP, SHOB, wheezing.      Need to re-collect vaginal swab today as prior sample was insufficient for testing.    The following portions of the patient's history were reviewed and updated as appropriate: past medical history, past surgical history, family history, social history, allergies, medications, and problem list.   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 No pertinent surgical history. Family History  Problem Relation Age of Onset   Heart attack Mother    Parkinson's disease Mother     CAD Mother    Alzheimer's disease Father    Diabetes Father    Hypertension Father     Current Outpatient Medications:    amphetamine-dextroamphetamine (ADDERALL) 30 MG tablet, Take 1 tablet by mouth 2 (two) times daily., Disp: , Rfl:    BD PEN NEEDLE NANO U/F 32G X 4 MM MISC, USE AS DIRECTED ONCE DAILY WITH LEVEMIR AND 3 TIMES DAILY FOR HUMALOG, Disp: , Rfl: 4   Blood Glucose Monitoring Suppl (CONTOUR NEXT MONITOR) w/Device KIT, USE TO TEST BLOOD SUGAR BID, Disp: , Rfl: 0   buPROPion (WELLBUTRIN XL) 300 MG 24 hr tablet, Take 1 tablet (300 mg total) by mouth daily., Disp: 90 tablet, Rfl: 3   cholestyramine (QUESTRAN) 4 g packet, Take 4 g by mouth daily., Disp: , Rfl:    clonazePAM (KLONOPIN) 1 MG tablet, TAKE 1 TABLET BY MOUTH 3 TIMES DAILY AS NEEDED FOR ANXIETY., Disp: 30 tablet, Rfl: 1   colestipol (COLESTID) 1 g tablet, Take 1 g by mouth., Disp: , Rfl:    CONTOUR NEXT TEST test strip, , Disp: , Rfl: 2   diclofenac sodium (VOLTAREN) 1 % GEL, APPLY 4 GRAMS EXTERNALLY TO THE AFFECTED AREA TWICE DAILY, Disp: 100 g, Rfl: 0   estradiol (VIVELLE-DOT) 0.05 MG/24HR patch, USE UTD, Disp: , Rfl: 11   glycopyrrolate (ROBINUL) 1 MG tablet, Take 1 mg by mouth 2 (two) times daily., Disp: , Rfl:    halobetasol (ULTRAVATE) 0.05 % ointment, , Disp: , Rfl: 0   hyoscyamine (LEVBID) 0.375 MG 12 hr tablet, Take 0.375 mg by mouth 2 (  two) times daily., Disp: , Rfl:    insulin aspart (NOVOLOG FLEXPEN) 100 UNIT/ML FlexPen, Inject 5 Units into the skin daily as needed for high blood sugar. PRN, Disp: , Rfl:    LEVEMIR FLEXTOUCH 100 UNIT/ML Pen, Inject 12 Units into the skin at bedtime., Disp: , Rfl: 12   meloxicam (MOBIC) 15 MG tablet, Take 1 tablet (15 mg total) by mouth daily as needed for pain., Disp: 30 tablet, Rfl: 2   pantoprazole (PROTONIX) 20 MG tablet, Take 20 mg by mouth daily., Disp: , Rfl:    progesterone (PROMETRIUM) 100 MG capsule, Take 100 mg by mouth at bedtime., Disp: , Rfl:    Semaglutide,0.25  or 0.5MG /DOS, (OZEMPIC, 0.25 OR 0.5 MG/DOSE,) 2 MG/3ML SOPN, INJECT 0.5 MG INTO THE SKIN ONE TIME PER WEEK, Disp: 3 mL, Rfl: 2   simvastatin (ZOCOR) 20 MG tablet, TK 1 T PO QPM, Disp: , Rfl: 0   valACYclovir (VALTREX) 1000 MG tablet, , Disp: , Rfl: 3   VIIBRYD 40 MG TABS, TK 1 T PO QD WITH A FULL MEAL, Disp: 30 tablet, Rfl: 12 Allergies  Allergen Reactions   Codeine    Other Other (See Comments)     ROS: A complete ROS was performed with pertinent positives/negatives noted in the HPI. The remainder of the ROS are negative.    Objective:   Today's Vitals   10/07/23 1108  BP: 120/80  Pulse: 77  Temp: 98.6 F (37 C)  TempSrc: Temporal  SpO2: 99%  Weight: 140 lb 9.6 oz (63.8 kg)  Height: 5\' 4"  (1.626 m)    GENERAL: Well-appearing, in NAD. Well nourished.  SKIN: Pink, warm and dry. No rash, lesion, ulceration, or ecchymoses.  HEENT:    HEAD: Normocephalic, non-traumatic.  EYES: Conjunctive pink without exudate.  EARS: External ear w/o redness, swelling, masses, or lesions. EAC clear. TM's intact, cloudy w/o bulging, appropriate landmarks visualized.  NOSE: Septum midline w/o deformity. Nares patent, mucosa pink and inflamed with nasal congestion. Frontal sinus tenderness  THROAT: Uvula midline. Oropharynx clear. Tonsils non-inflamed w/o exudate. Mucus membranes pink and moist.  NECK: Trachea midline. Full ROM w/o pain or tenderness. No lymphadenopathy.  RESPIRATORY: Chest wall symmetrical. Respirations even and non-labored. Breath sounds clear to auscultation bilaterally.  CARDIAC: S1, S2 present, regular rate and rhythm. Peripheral pulses 2+ bilaterally.  EXTREMITIES: Without clubbing, cyanosis, or edema.  NEUROLOGIC: Steady, even gait.  PSYCH/MENTAL STATUS: Alert, oriented x 3. Cooperative, appropriate mood and affect.    Results for orders placed or performed in visit on 10/07/23  POC COVID-19  Result Value Ref Range   SARS Coronavirus 2 Ag Negative Negative       Assessment & Plan:  Assessment and Plan    Upper Respiratory Infection (URI) Presents with sinus congestion, frontal headache, and scratchy throat. No fever or other systemic symptoms. COVID-19 swab negative. -Advise use of Tylenol or ibuprofen as needed for pain. -Recommend Flonase nasal spray and over-the-counter Zyrtec for nasal and sinus congestion. -Suggest a decongestant such as pseudoephedrine or phenylephrine for nasal congestion. -Advise use of a neti pot or NeilMed sinus rinse. -For sore throat, recommend warm salt water gargles and chloraseptic spray. -If symptoms persist or worsen over the next 10 days, patient should contact the office.   Dysuria  inadequate specimen collection from last visit, re-collecting today.      Orders Placed This Encounter  Procedures   POC COVID-19    Order Specific Question:   Previously tested for COVID-19  Answer:   No    Order Specific Question:   Resident in a congregate (group) care setting    Answer:   No    Order Specific Question:   Employed in healthcare setting    Answer:   No    Order Specific Question:   Pregnant    Answer:   No   Lab Orders         POC COVID-19     No images are attached to the encounter or orders placed in the encounter.  Return if symptoms worsen or fail to improve.   Salvatore Decent, FNP

## 2023-10-06 NOTE — Telephone Encounter (Signed)
Pt called in stating she had a flat tire and would not be able to make her OV with Morrie Sheldon on 10/06/23.

## 2023-10-07 ENCOUNTER — Ambulatory Visit (INDEPENDENT_AMBULATORY_CARE_PROVIDER_SITE_OTHER): Payer: Medicaid Other | Admitting: Internal Medicine

## 2023-10-07 ENCOUNTER — Other Ambulatory Visit (HOSPITAL_COMMUNITY)
Admission: RE | Admit: 2023-10-07 | Discharge: 2023-10-07 | Disposition: A | Discharge status: Home / Self Care | Payer: Medicaid Other | Source: Ambulatory Visit | Attending: Internal Medicine | Admitting: Internal Medicine

## 2023-10-07 ENCOUNTER — Encounter: Payer: Self-pay | Admitting: Internal Medicine

## 2023-10-07 VITALS — BP 120/80 | HR 77 | Temp 98.6°F | Ht 64.0 in | Wt 140.6 lb

## 2023-10-07 DIAGNOSIS — R300 Dysuria: Secondary | ICD-10-CM | POA: Insufficient documentation

## 2023-10-07 DIAGNOSIS — J069 Acute upper respiratory infection, unspecified: Secondary | ICD-10-CM

## 2023-10-07 LAB — POC COVID19 BINAXNOW: SARS Coronavirus 2 Ag: NEGATIVE

## 2023-10-07 NOTE — Patient Instructions (Addendum)
Rest, drink plenty of fluids.  Tylenol for fever, body aches.   For cough: Take Mucinex DM or Robitussin-DM over-the-counter.  Follow the instructions in the box.  For nasal and sinus congestion: Use over-the-counter Flonase: 2 nasal sprays on each side of the nose in the morning until you feel better  Use over- the - counter Zyrtec (Cetirizine) once daily   Patients without high blood pressure: can use decongestants such as pseudoephedrine or phenylephrine no more than 3-5 days.   Patients with high blood pressure can use Coricidin HBP for decongestant, it will not raise your blood pressure.    Nedipot or NeilMed Sinus rinse   Sore throat: Tylenol  Warm salt water gargles  Chloraseptic spray      Call if not gradually better over the next  10 days  Call anytime if the symptoms are severe, you have high fever, short of breath, chest pain

## 2023-10-10 LAB — CERVICOVAGINAL ANCILLARY ONLY
Bacterial Vaginitis (gardnerella): NEGATIVE
Candida Glabrata: NEGATIVE
Candida Vaginitis: NEGATIVE
Chlamydia: NEGATIVE
Comment: NEGATIVE
Comment: NEGATIVE
Comment: NEGATIVE
Comment: NEGATIVE
Comment: NEGATIVE
Comment: NORMAL
Neisseria Gonorrhea: NEGATIVE
Trichomonas: NEGATIVE

## 2023-10-10 NOTE — Telephone Encounter (Signed)
Provider aware

## 2023-10-10 NOTE — Telephone Encounter (Signed)
 1st missed visit, letter sent via mychart

## 2023-10-12 ENCOUNTER — Other Ambulatory Visit: Payer: Self-pay

## 2023-10-12 DIAGNOSIS — F330 Major depressive disorder, recurrent, mild: Secondary | ICD-10-CM

## 2023-10-12 MED ORDER — BUPROPION HCL ER (XL) 300 MG PO TB24
300.0000 mg | ORAL_TABLET | Freq: Every day | ORAL | 3 refills | Status: DC
Start: 2023-10-12 — End: 2024-05-10

## 2023-10-20 ENCOUNTER — Ambulatory Visit: Payer: Self-pay | Admitting: Internal Medicine

## 2023-10-20 NOTE — Telephone Encounter (Signed)
Copied from CRM [id number]. Topic: Clinical - Red Word Triage >> Oct 20, 2023  1:38 PM Samuel Jester B wrote: Red Word that prompted transfer to Nurse Triage: PT stated that she has been feeling nausea, a temperature of 100.1, an diarrhea.   Chief Complaint: nausea Symptoms: loos stool Frequency: ongoing since 10/19/23 Pertinent Negatives: Patient denies fever and abdominal pain Disposition: [] ED /[] Urgent Care (no appt availability in office) / [x] Appointment(In office/virtual)/ []  [facility]/ [] Home Care/ [] Refused Recommended Disposition /[] [facility]/ []  Follow-up with PCP Additional Notes: The patient reported nausea and two instances vomiting yesterday.  Today she had loose stool three times.  She still feels nauseated.  She has not eaten since yesterday at 2 pm.  She has been drinking water but very little.  She is urinating She stated that her granddaughter stayed home from school Tuesday due to a stomach virus and she thinks she may have caught it.  She was scheduled for the earliest available appointment and placed on cancellation list for a sooner appointment due to lack of oral intake due to nausae. Advised to go to urgent care if symptoms worsen prior to appointment.     Reason for Disposition  Unexplained nausea  MILD-MODERATE diarrhea (e.g., 1-6 times / day more than normal)  Answer Assessment - Initial Assessment Questions 1. DIARRHEA SEVERITY: "How bad is the diarrhea?" "How many more stools have you had in the past 24 hours than normal?"    - NO DIARRHEA (SCALE 0)   - MILD (SCALE 1-3): Few loose or mushy BMs; increase of 1-3 stools over normal daily number of stools; mild increase in ostomy output.   -  MODERATE (SCALE 4-7): Increase of 4-6 stools daily over normal; moderate increase in ostomy output.   -  SEVERE (SCALE 8-10; OR "WORST POSSIBLE"): Increase of 7 or more stools daily over normal; moderate increase in ostomy output; incontinence.     3 -  basically water  2. ONSET: "When did the diarrhea begin?"      This morning  3. BM CONSISTENCY: "How loose or watery is the diarrhea?"      Water  4. VOMITING: "Are you also vomiting?" If Yes, ask: "How many times in the past 24 hours?"      Vomited twice yesterday  5. ABDOMEN PAIN: "Are you having any abdomen pain?" If Yes, ask: "What does it feel like?" (e.g., crampy, dull, intermittent, constant)      none 6. ABDOMEN PAIN SEVERITY: If present, ask: "How bad is the pain?"  (e.g., Scale 1-10; mild, moderate, or severe)   - MILD (1-3): doesn't interfere with normal activities, abdomen soft and not tender to touch    - MODERATE (4-7): interferes with normal activities or awakens from sleep, abdomen tender to touch    - SEVERE (8-10): excruciating pain, doubled over, unable to do any normal activities       N/A 7. ORAL INTAKE: If vomiting, "Have you been able to drink liquids?" "How much liquids have you had in the past 24 hours?"     Water today - less than half of a bottle of water 8. HYDRATION: "Any signs of dehydration?" (e.g., dry mouth [not just dry lips], too weak to stand, dizziness, new weight loss) "When did you last urinate?"     Mouth dry, feels week Denies dizziness 9. EXPOSURE: "Have you traveled to a foreign country recently?" "Have you been exposed to anyone with diarrhea?" "Could you have eaten any  food that was spoiled?"     Kept granddaughter Monday who was sick as well  10. ANTIBIOTIC USE: "Are you taking antibiotics now or have you taken antibiotics in the past 2 months?"       none 11. OTHER SYMPTOMS: "Do you have any other symptoms?" (e.g., fever, blood in stool)       Nausea  Answer Assessment - Initial Assessment Questions 1. NAUSEA SEVERITY: "How bad is the nausea?" (e.g., mild, moderate, severe; dehydration, weight loss)   - MILD: loss of appetite without change in eating habits   - MODERATE: decreased oral intake without significant weight loss, dehydration, or  malnutrition   - SEVERE: inadequate caloric or fluid intake, significant weight loss, symptoms of dehydration     Unable to eat due to nausea 2. ONSET: "When did the nausea begin?"     Yesterday morning  3. VOMITING: "Any vomiting?" If Yes, ask: "How many times today?"     Twice yesterday  4. RECURRENT SYMPTOM: "Have you had nausea before?" If Yes, ask: "When was the last time?" "What happened that time?"     No 5. CAUSE: "What do you think is causing the nausea?"     Granddaughter sick  Protocols used: Diarrhea-A-AH, Nausea-A-AH

## 2023-10-20 NOTE — Telephone Encounter (Signed)
FYI

## 2023-10-21 ENCOUNTER — Encounter: Payer: Self-pay | Admitting: Family Medicine

## 2023-10-21 ENCOUNTER — Telehealth (INDEPENDENT_AMBULATORY_CARE_PROVIDER_SITE_OTHER): Payer: Medicaid Other | Admitting: Family Medicine

## 2023-10-21 DIAGNOSIS — K529 Noninfective gastroenteritis and colitis, unspecified: Secondary | ICD-10-CM | POA: Diagnosis not present

## 2023-10-21 MED ORDER — ONDANSETRON HCL 4 MG PO TABS: 4.0000 mg | ORAL_TABLET | Freq: Three times a day (TID) | ORAL | 1 refills | Status: DC | PRN

## 2023-10-21 MED ORDER — ONDANSETRON 4 MG PO TBDP
4.0000 mg | ORAL_TABLET | Freq: Three times a day (TID) | ORAL | 1 refills | Status: AC | PRN
Start: 2023-10-21 — End: ?

## 2023-10-21 NOTE — Progress Notes (Deleted)
   Established Patient Office Visit  Subjective   Patient ID: Margaret Higgins, female    DOB: 1966-03-24  Age: 57 y.o. MRN: [id number]  Chief Complaint  Patient presents with   Nausea   Diarrhea    HPI  {History (Optional):23778}  ROS    Objective:     There were no vitals taken for this visit. {Vitals History (Optional):23777}  Physical Exam   No results found for any visits on 10/21/23.  {Labs (Optional):23779}  The 10-year ASCVD risk score (Arnett DK, et al., 2019) is: 4.2%    Assessment & Plan:   Problem List Items Addressed This Visit   None   No follow-ups on file.    Donato Schultz, DO

## 2023-10-21 NOTE — Progress Notes (Signed)
MyChart Video Visit    Virtual Visit via Video Note   This patient is at least at moderate risk for complications without adequate follow up. This format is felt to be most appropriate for this patient at this time. Physical exam was limited by quality of the video and audio technology used for the visit. [name] was able to get the patient set up on a video visit.  Patient location: home in bed Patient and provider in visit Provider location: Office  I discussed the limitations of evaluation and management by telemedicine and the availability of in person appointments. The patient expressed understanding and agreed to proceed.  Visit Date: 10/21/2023  Today's healthcare provider: Donato Schultz, DO     Subjective:    Patient ID: Margaret Higgins, female    DOB: [birth year], 57 y.o.   MRN: [id number]  Chief Complaint  Patient presents with   Nausea   Diarrhea    HPI Patient is in today for c/o NVD Discussed the use of AI scribe software for clinical note transcription with the patient, who gave verbal consent to proceed.  History of Present Illness   The patient, with a history of diabetes, presented with symptoms of a gastrointestinal illness that began on Tuesday night. She experienced vomiting, diarrhea, and a fever of 100.17F. The patient reported that the vomiting ceased around 5 PM the previous day and the diarrhea had also resolved. She managed to consume and retain some plain rice without any issues. However, she still felt a bit nauseated.  The patient had not taken any over-the-counter medications for her symptoms, except for Tylenol. She had previously been prescribed Zofran for occasional bouts of nausea, possibly related to her diabetes, but currently had none on hand.  The patient also mentioned a recent visit to a pediatric dentist's office with her granddaughter, suggesting a possible source of exposure to a stomach bug.      Past Medical History:   Diagnosis Date   ADD (attention deficit disorder)    CKD (chronic kidney disease)    Diabetic neuropathy (HCC)    Gestational diabetes    Hypercholesterolemia    Hyperlipidemia    Migraine     No past surgical history on file.  Family History  Problem Relation Age of Onset   Heart attack Mother    Parkinson's disease Mother    CAD Mother    Alzheimer's disease Father    Diabetes Father    Hypertension Father     Social History   Socioeconomic History   Marital status: Widowed    Spouse name: Not on file   Number of children: 3   Years of education: 28   Highest education level: 12th grade  Occupational History   Occupation: home maker  Tobacco Use   Smoking status: Former   Smokeless tobacco: Never  Advertising account planner   Vaping status: Never Used  Substance and Sexual Activity   Alcohol use: No   Drug use: No   Sexual activity: Not on file  Other Topics Concern   Not on file  Social History Narrative   Patient lives with husband in a 2 story home.  Has 3 children.  Works as a Arts development officer.  Education: college degree.    Social Drivers of Health   Financial Resource Strain: Patient Declined (05/23/2023)   Overall Financial Resource Strain (CARDIA)    Difficulty of Paying Living Expenses: Patient declined  Food Insecurity: Patient Declined (05/23/2023)  Hunger Vital Sign    Worried About Running Out of Food in the Last Year: Patient declined    Ran Out of Food in the Last Year: Patient declined  Transportation Needs: No Transportation Needs (05/23/2023)   PRAPARE - Administrator, Civil Service (Medical): No    Lack of Transportation (Non-Medical): No  Physical Activity: Sufficiently Active (05/23/2023)   Exercise Vital Sign    Days of Exercise per Week: 7 days    Minutes of Exercise per Session: 30 min  Stress: Stress Concern Present (05/23/2023)   [employer] of Occupational Health - Occupational Stress Questionnaire    Feeling of Stress : To some  extent  Social Connections: Unknown (05/23/2023)   Social Connection and Isolation Panel [NHANES]    Frequency of Communication with Friends and Family: More than three times a week    Frequency of Social Gatherings with Friends and Family: Once a week    Attends Religious Services: Patient declined    Database administrator or Organizations: No    Attends Engineer, structural: Not on file    Marital Status: Widowed  Intimate Partner Violence: Not on file    Outpatient Medications Prior to Visit  Medication Sig Dispense Refill   amphetamine-dextroamphetamine (ADDERALL) 30 MG tablet Take 1 tablet by mouth 2 (two) times daily.     BD PEN NEEDLE NANO U/F 32G X 4 MM MISC USE AS DIRECTED ONCE DAILY WITH LEVEMIR AND 3 TIMES DAILY FOR HUMALOG  4   Blood Glucose Monitoring Suppl (CONTOUR NEXT MONITOR) w/Device KIT USE TO TEST BLOOD SUGAR BID  0   buPROPion (WELLBUTRIN XL) 300 MG 24 hr tablet Take 1 tablet (300 mg total) by mouth daily. 90 tablet 3   cholestyramine (QUESTRAN) 4 g packet Take 4 g by mouth daily.     clonazePAM (KLONOPIN) 1 MG tablet TAKE 1 TABLET BY MOUTH 3 TIMES DAILY AS NEEDED FOR ANXIETY. 30 tablet 1   colestipol (COLESTID) 1 g tablet Take 1 g by mouth.     CONTOUR NEXT TEST test strip   2   diclofenac sodium (VOLTAREN) 1 % GEL APPLY 4 GRAMS EXTERNALLY TO THE AFFECTED AREA TWICE DAILY 100 g 0   estradiol (VIVELLE-DOT) 0.05 MG/24HR patch USE UTD  11   glycopyrrolate (ROBINUL) 1 MG tablet Take 1 mg by mouth 2 (two) times daily.     halobetasol (ULTRAVATE) 0.05 % ointment   0   hyoscyamine (LEVBID) 0.375 MG 12 hr tablet Take 0.375 mg by mouth 2 (two) times daily.     insulin aspart (NOVOLOG FLEXPEN) 100 UNIT/ML FlexPen Inject 5 Units into the skin daily as needed for high blood sugar. PRN     LEVEMIR FLEXTOUCH 100 UNIT/ML Pen Inject 12 Units into the skin at bedtime.  12   meloxicam (MOBIC) 15 MG tablet Take 1 tablet (15 mg total) by mouth daily as needed for pain. 30  tablet 2   pantoprazole (PROTONIX) 20 MG tablet Take 20 mg by mouth daily.     progesterone (PROMETRIUM) 100 MG capsule Take 100 mg by mouth at bedtime.     Semaglutide,0.25 or 0.5MG /DOS, (OZEMPIC, 0.25 OR 0.5 MG/DOSE,) 2 MG/3ML SOPN INJECT 0.5 MG INTO THE SKIN ONE TIME PER WEEK 3 mL 2   simvastatin (ZOCOR) 20 MG tablet TK 1 T PO QPM  0   valACYclovir (VALTREX) 1000 MG tablet   3   VIIBRYD 40 MG TABS TK 1 T PO  QD WITH A FULL MEAL 30 tablet 12   No facility-administered medications prior to visit.    Allergies  Allergen Reactions   Codeine    Other Other (See Comments)    Review of Systems  Constitutional:  Positive for chills and fever. Negative for malaise/fatigue.  HENT:  Negative for congestion.   Eyes:  Negative for blurred vision.  Respiratory:  Negative for cough and shortness of breath.   Cardiovascular:  Negative for chest pain, palpitations and leg swelling.  Gastrointestinal:  Positive for diarrhea, nausea and vomiting. Negative for abdominal pain, blood in stool and constipation.  Genitourinary:  Negative for dysuria and frequency.  Musculoskeletal:  Negative for back pain and falls.  Skin:  Negative for rash.  Neurological:  Negative for dizziness, loss of consciousness and headaches.  Endo/Heme/Allergies:  Negative for environmental allergies.  Psychiatric/Behavioral:  Negative for depression. The patient is not nervous/anxious.        Objective:    Physical Exam Vitals and nursing note reviewed.  Constitutional:      Appearance: She is ill-appearing.  Neurological:     General: No focal deficit present.     Mental Status: Mental status is at baseline. She is disoriented.     There were no vitals taken for this visit. Wt Readings from Last 3 Encounters:  10/07/23 140 lb 9.6 oz (63.8 kg)  09/27/23 138 lb 12.8 oz (63 kg)  07/27/23 136 lb 3.2 oz (61.8 kg)       Assessment & Plan:  Gastroenteritis -     Ondansetron; Take 1 tablet (4 mg total) by mouth  every 8 (eight) hours as needed for nausea or vomiting.  Dispense: 20 tablet; Refill: 1   Assessment and Plan    Acute Gastroenteritis   She presented with acute onset of vomiting, diarrhea, and low-grade fever since Tuesday night, showing improvement with cessation of vomiting and diarrhea since yesterday evening. Mild residual nausea remains. The likely viral etiology is suggested by recent exposure to a pediatric dentist office and the community presence of a stomach bug. No signs of severe dehydration or complications are present. We will prescribe Zofran ODT for nausea, which has been previously effective. The recommendation includes a BRAT diet (bananas, rice, applesauce, toast) and the intake of clear fluids such as Gatorade or Pedialyte, sipped slowly. She is instructed to seek emergency care if vomiting persists despite Zofran or if symptoms worsen.  Diabetes Mellitus   Her diabetes mellitus may contribute to occasional nausea, though no acute issues related to diabetes were discussed during this visit. She will monitor blood glucose levels as usual and continue the current diabetes management plan.  General Health Maintenance   Hydration and dietary management during illness were discussed. She is encouraged to rest and gradually reintroduce her regular diet as tolerated.        I discussed the assessment and treatment plan with the patient. The patient was provided an opportunity to ask questions and all were answered. The patient agreed with the plan and demonstrated an understanding of the instructions.   The patient was advised to call back or seek an in-person evaluation if the symptoms worsen or if the condition fails to improve as anticipated.  Donato Schultz, DO [place] [facility] [phone] (phone) [phone] (fax)  [facility]

## 2023-11-02 DIAGNOSIS — Z419 Encounter for procedure for purposes other than remedying health state, unspecified: Secondary | ICD-10-CM | POA: Diagnosis not present

## 2023-11-18 ENCOUNTER — Ambulatory Visit: Payer: Self-pay

## 2023-11-18 ENCOUNTER — Ambulatory Visit: Payer: Self-pay | Admitting: Internal Medicine

## 2023-11-18 NOTE — Telephone Encounter (Signed)
  Chief Complaint: earache Symptoms: L ear Frequency: began this morning Pertinent Negatives: Patient denies drainage, hearing loss, fever Disposition: [] ED /[x] Urgent Care (no appt availability in office) / [] Appointment(In office/virtual)/ []  [facility]/ [] Home Care/ [] Refused Recommended Disposition /[]  Mobile Bus/ []  Follow-up with PCP Additional Notes: Patient called reporting L ear pain that began at 0300 today. States pain is sharp in nature. Reports she is recently recovering from a cold last week. Denies fever, drainage, hearing loss. Per protocol, patient to be evaluated within 4 hours. Next available appt with PCP 1/20 @ 0900. Pt states she would like to go to [facility] on [address]. Request appt be scheduled, only available appt 1845 today- scheduled per patient request. Care advice reviewed with patient, understanding verbalized. Denies further questions at this time. Alerting PCP for review.    Copied from CRM [id number]. Topic: Clinical - Red Word Triage >> Nov 18, 2023 11:43 AM Shelbie Proctor wrote: Red Word that prompted transfer to Nurse Triage: Patient [phone] states horrible left ear infection started today, stabbing throbbing pain in the left ear, every time she swallow feels like popcorn in the ear, head congestion- stocked up had cold last week. Patient denies a fever. Reason for Disposition  [1] SEVERE pain AND [2] not improved 2 hours after taking analgesic medication (e.g., ibuprofen or acetaminophen)  Answer Assessment - Initial Assessment Questions 1. LOCATION: "Which ear is involved?"     L ear 2. ONSET: "When did the ear start hurting"      Woke her up at 0300 today, intermittent 3. SEVERITY: "How bad is the pain?"  (Scale 1-10; mild, moderate or severe)   - MILD (1-3): doesn't interfere with normal activities    - MODERATE (4-7): interferes with normal activities or awakens from sleep    - SEVERE (8-10): excruciating pain, unable to do any  normal activities      6/10 when shooting through ear 4. URI SYMPTOMS: "Do you have a runny nose or cough?"     Head congestion, recovering from a bad cold last week. 5. FEVER: "Do you have a fever?" If Yes, ask: "What is your temperature, how was it measured, and when did it start?"     Denies 6. CAUSE: "Have you been swimming recently?", "How often do you use Q-TIPS?", "Have you had any recent air travel or scuba diving?"     Denies 7. OTHER SYMPTOMS: "Do you have any other symptoms?" (e.g., headache, stiff neck, dizziness, vomiting, runny nose, decreased hearing)     Denies  Protocols used: Earache-A-AH

## 2023-11-18 NOTE — Telephone Encounter (Signed)
FYI

## 2023-12-03 DIAGNOSIS — Z419 Encounter for procedure for purposes other than remedying health state, unspecified: Secondary | ICD-10-CM | POA: Diagnosis not present

## 2023-12-06 DIAGNOSIS — Z1231 Encounter for screening mammogram for malignant neoplasm of breast: Secondary | ICD-10-CM | POA: Diagnosis not present

## 2023-12-06 DIAGNOSIS — Z1151 Encounter for screening for human papillomavirus (HPV): Secondary | ICD-10-CM | POA: Diagnosis not present

## 2023-12-06 DIAGNOSIS — Z0000 Encounter for general adult medical examination without abnormal findings: Secondary | ICD-10-CM | POA: Diagnosis not present

## 2023-12-06 DIAGNOSIS — Z124 Encounter for screening for malignant neoplasm of cervix: Secondary | ICD-10-CM | POA: Diagnosis not present

## 2023-12-06 DIAGNOSIS — Z6825 Body mass index (BMI) 25.0-25.9, adult: Secondary | ICD-10-CM | POA: Diagnosis not present

## 2023-12-07 ENCOUNTER — Other Ambulatory Visit (HOSPITAL_BASED_OUTPATIENT_CLINIC_OR_DEPARTMENT_OTHER): Payer: Self-pay | Admitting: Obstetrics and Gynecology

## 2023-12-07 DIAGNOSIS — Z8249 Family history of ischemic heart disease and other diseases of the circulatory system: Secondary | ICD-10-CM

## 2023-12-13 ENCOUNTER — Other Ambulatory Visit: Payer: Self-pay | Admitting: Internal Medicine

## 2023-12-13 DIAGNOSIS — Z794 Long term (current) use of insulin: Secondary | ICD-10-CM

## 2023-12-31 DIAGNOSIS — Z419 Encounter for procedure for purposes other than remedying health state, unspecified: Secondary | ICD-10-CM | POA: Diagnosis not present

## 2024-01-07 ENCOUNTER — Other Ambulatory Visit: Payer: Self-pay | Admitting: Family

## 2024-01-07 ENCOUNTER — Other Ambulatory Visit: Payer: Self-pay | Admitting: Internal Medicine

## 2024-01-07 DIAGNOSIS — F321 Major depressive disorder, single episode, moderate: Secondary | ICD-10-CM

## 2024-01-07 DIAGNOSIS — M150 Primary generalized (osteo)arthritis: Secondary | ICD-10-CM

## 2024-01-26 ENCOUNTER — Encounter: Payer: Self-pay | Admitting: Internal Medicine

## 2024-01-26 ENCOUNTER — Ambulatory Visit (INDEPENDENT_AMBULATORY_CARE_PROVIDER_SITE_OTHER): Admitting: Internal Medicine

## 2024-01-26 ENCOUNTER — Other Ambulatory Visit: Payer: Self-pay

## 2024-01-26 VITALS — BP 128/76 | HR 83 | Temp 98.2°F | Ht 64.0 in | Wt 146.2 lb

## 2024-01-26 DIAGNOSIS — H65191 Other acute nonsuppurative otitis media, right ear: Secondary | ICD-10-CM | POA: Diagnosis not present

## 2024-01-26 DIAGNOSIS — R202 Paresthesia of skin: Secondary | ICD-10-CM | POA: Diagnosis not present

## 2024-01-26 DIAGNOSIS — R200 Anesthesia of skin: Secondary | ICD-10-CM

## 2024-01-26 DIAGNOSIS — F321 Major depressive disorder, single episode, moderate: Secondary | ICD-10-CM

## 2024-01-26 MED ORDER — AMPHETAMINE-DEXTROAMPHETAMINE 30 MG PO TABS: 1.0000 | ORAL_TABLET | Freq: Two times a day (BID) | ORAL | 0 refills | Status: DC

## 2024-01-26 MED ORDER — CLONAZEPAM 1 MG PO TABS: 1.0000 mg | ORAL_TABLET | Freq: Two times a day (BID) | ORAL | 1 refills | Status: AC | PRN

## 2024-01-26 NOTE — Progress Notes (Signed)
 [facility] [address] [place] [address] Dept: [phone] Dept Fax: [phone]  Acute Care Office Visit  Subjective:   Margaret Higgins 1966/07/10 01/26/2024  Chief Complaint  Patient presents with   Foot Pain    Left foot feels sleep started last summer     HPI: Discussed the use of AI scribe software for clinical note transcription with the patient, who gave verbal consent to proceed.  History of Present Illness   The patient, with a history of diabetes, presents with persistent numbness in the left foot, described as a sensation of the foot being "asleep" all the time. The numbness affects the entire foot and has been present for an extended period, with the patient unable to specify when it first started. Occasionally, the patient experiences a sensation akin to having a pebble in her shoe, which is associated with pain. The patient denies any exacerbating factors and reports no relief with the use of voltaren gel. The patient also mentions an incident about six months ago when a food can fell on her foot, causing injury to the 3rd toe, but she was have symptoms prior to this.  The patient has no similar symptoms in the right foot.   She also reports have "crackling" in her R. ear ongoing for several weeks after having URI.         The following portions of the patient's history were reviewed and updated as appropriate: past medical history, past surgical history, family history, social history, allergies, medications, and problem list.   Patient Active Problem List   Diagnosis Date Noted   Attention deficit hyperactivity disorder, predominantly inattentive type 04/12/2023   Diabetes mellitus (HCC) 04/12/2023   Hyperlipidemia 04/12/2023   Irritable bowel syndrome 04/12/2023   Osteoarthritis 04/12/2023   Migraine 04/12/2023   Anxiety 04/12/2023   Depression, major, single episode, moderate (HCC) 04/12/2023    Post-menopausal 04/12/2023   History of cervical dysplasia 09/22/2021   Past Medical History:  Diagnosis Date   ADD (attention deficit disorder)    CKD (chronic kidney disease)    Diabetic neuropathy (HCC)    Gestational diabetes    Hypercholesterolemia    Hyperlipidemia    Migraine    History reviewed. No pertinent surgical history. Family History  Problem Relation Age of Onset   Heart attack Mother    Parkinson's disease Mother    CAD Mother    Alzheimer's disease Father    Diabetes Father    Hypertension Father     Current Outpatient Medications:    amphetamine-dextroamphetamine (ADDERALL) 30 MG tablet, Take 1 tablet by mouth 2 (two) times daily., Disp: , Rfl:    BD PEN NEEDLE NANO U/F 32G X 4 MM MISC, USE AS DIRECTED ONCE DAILY WITH LEVEMIR AND 3 TIMES DAILY FOR HUMALOG, Disp: , Rfl: 4   Blood Glucose Monitoring Suppl (CONTOUR NEXT MONITOR) w/Device KIT, USE TO TEST BLOOD SUGAR BID, Disp: , Rfl: 0   cholestyramine (QUESTRAN) 4 g packet, Take 4 g by mouth daily., Disp: , Rfl:    clonazePAM (KLONOPIN) 1 MG tablet, TAKE 1 TABLET BY MOUTH 3 TIMES DAILY AS NEEDED FOR ANXIETY., Disp: 30 tablet, Rfl: 1   colestipol (COLESTID) 1 g tablet, Take 1 g by mouth., Disp: , Rfl:    CONTOUR NEXT TEST test strip, , Disp: , Rfl: 2   diclofenac sodium (VOLTAREN) 1 % GEL, APPLY 4 GRAMS EXTERNALLY TO THE AFFECTED AREA TWICE DAILY, Disp: 100 g, Rfl:  0   estradiol (VIVELLE-DOT) 0.05 MG/24HR patch, USE UTD, Disp: , Rfl: 11   glycopyrrolate (ROBINUL) 1 MG tablet, Take 1 mg by mouth 2 (two) times daily., Disp: , Rfl:    halobetasol (ULTRAVATE) 0.05 % ointment, , Disp: , Rfl: 0   hyoscyamine (LEVBID) 0.375 MG 12 hr tablet, Take 0.375 mg by mouth 2 (two) times daily., Disp: , Rfl:    insulin aspart (NOVOLOG FLEXPEN) 100 UNIT/ML FlexPen, Inject 5 Units into the skin daily as needed for high blood sugar. PRN, Disp: , Rfl:    LEVEMIR FLEXTOUCH 100 UNIT/ML Pen, Inject 12 Units into the skin at bedtime.,  Disp: , Rfl: 12   meloxicam (MOBIC) 15 MG tablet, TAKE 1 TABLET BY MOUTH EVERY DAY AS NEEDED FOR PAIN, Disp: 30 tablet, Rfl: 2   ondansetron (ZOFRAN-ODT) 4 MG disintegrating tablet, Take 1 tablet (4 mg total) by mouth every 8 (eight) hours as needed for nausea or vomiting., Disp: 20 tablet, Rfl: 1   pantoprazole (PROTONIX) 20 MG tablet, Take 20 mg by mouth daily., Disp: , Rfl:    progesterone (PROMETRIUM) 100 MG capsule, Take 100 mg by mouth at bedtime., Disp: , Rfl:    Semaglutide,0.25 or 0.5MG /DOS, (OZEMPIC, 0.25 OR 0.5 MG/DOSE,) 2 MG/3ML SOPN, INJECT 0.5 MG INTO THE SKIN ONE TIME PER WEEK, Disp: 9 mL, Rfl: 2   simvastatin (ZOCOR) 20 MG tablet, TK 1 T PO QPM, Disp: , Rfl: 0   valACYclovir (VALTREX) 1000 MG tablet, , Disp: , Rfl: 3   buPROPion (WELLBUTRIN XL) 300 MG 24 hr tablet, Take 1 tablet (300 mg total) by mouth daily., Disp: 90 tablet, Rfl: 3   VIIBRYD 40 MG TABS, TK 1 T PO QD WITH A FULL MEAL (Patient not taking: Reported on 01/26/2024), Disp: 30 tablet, Rfl: 12 Allergies  Allergen Reactions   Codeine    Other Other (See Comments)     ROS: A complete ROS was performed with pertinent positives/negatives noted in the HPI. The remainder of the ROS are negative.    Objective:   Today's Vitals   01/26/24 1307  BP: 128/76  Pulse: 83  Temp: 98.2 F (36.8 C)  TempSrc: Temporal  SpO2: 98%  Weight: 146 lb 3.2 oz (66.3 kg)  Height: 5\' 4"  (1.626 m)    GENERAL: Well-appearing, in NAD. Well nourished.  SKIN: Pink, warm and dry. No rash, lesion, ulceration, or ecchymoses.  HEENT:    HEAD: Normocephalic, non-traumatic.  EYES: Conjunctive pink without exudate.  EARS: External ear w/o redness, swelling, masses, or lesions. EAC clear. TM's intact, translucent w/o bulging, appropriate landmarks visualized.  NECK: Trachea midline. Full ROM w/o pain or tenderness. No lymphadenopathy.  RESPIRATORY: Chest wall symmetrical. Respirations even and non-labored. Breath sounds clear to auscultation  bilaterally.  CARDIAC: S1, S2 present, regular rate and rhythm. Peripheral pulses 2+ bilaterally.  EXTREMITIES: Without clubbing, cyanosis, or edema.  NEUROLOGIC: Steady, even gait. Decreased sensation to R. Foot with monofilament exam.  PSYCH/MENTAL STATUS: Alert, oriented x 3. Cooperative, appropriate mood and affect.    No results found for any visits on 01/26/24.    Assessment & Plan:  Assessment and Plan    Left foot numbness Chronic numbness with occasional pain and discoloration suggests peripheral neuropathy or peripheral arterial disease. Diabetes to contribute. - Order arterial ultrasound of the left lower extremity  - Consider referral to neurology for nerve conduction studies if ultrasound is normal.  Ear Effusion Chronic ear fullness with bubble wrap sound post-upper respiratory infection  suggests fluid presence. No infection observed. Flonase and Zyrtec recommended. - Recommend Flonase nasal spray and Zyrtec to help dry up any fluid in the ear. - Consider referral to ENT if symptoms do not improve.       No orders of the defined types were placed in this encounter.  No orders of the defined types were placed in this encounter.  Lab Orders  No laboratory test(s) ordered today   No images are attached to the encounter or orders placed in the encounter.  Return if symptoms worsen or fail to improve.   Salvatore Decent, FNP

## 2024-01-26 NOTE — Telephone Encounter (Signed)
 Patient requested refill while during office visit 01/26/24

## 2024-01-27 ENCOUNTER — Encounter: Payer: Self-pay | Admitting: Internal Medicine

## 2024-02-11 DIAGNOSIS — Z419 Encounter for procedure for purposes other than remedying health state, unspecified: Secondary | ICD-10-CM | POA: Diagnosis not present

## 2024-02-13 ENCOUNTER — Ambulatory Visit (HOSPITAL_COMMUNITY): Admission: RE | Admit: 2024-02-13 | Source: Ambulatory Visit

## 2024-02-29 ENCOUNTER — Ambulatory Visit (HOSPITAL_COMMUNITY)
Admission: RE | Admit: 2024-02-29 | Discharge: 2024-02-29 | Disposition: A | Discharge status: Home / Self Care | Source: Ambulatory Visit | Attending: Internal Medicine | Admitting: Internal Medicine

## 2024-02-29 DIAGNOSIS — R202 Paresthesia of skin: Secondary | ICD-10-CM | POA: Insufficient documentation

## 2024-02-29 DIAGNOSIS — R200 Anesthesia of skin: Secondary | ICD-10-CM | POA: Diagnosis not present

## 2024-02-29 LAB — VAS US ABI WITH/WO TBI
Left ABI: 1.17
Right ABI: 1.22

## 2024-03-02 ENCOUNTER — Encounter: Payer: Self-pay | Admitting: Internal Medicine

## 2024-03-02 DIAGNOSIS — K589 Irritable bowel syndrome without diarrhea: Secondary | ICD-10-CM

## 2024-03-02 DIAGNOSIS — E1140 Type 2 diabetes mellitus with diabetic neuropathy, unspecified: Secondary | ICD-10-CM

## 2024-03-02 DIAGNOSIS — F900 Attention-deficit hyperactivity disorder, predominantly inattentive type: Secondary | ICD-10-CM

## 2024-03-06 ENCOUNTER — Encounter: Payer: Self-pay | Admitting: Internal Medicine

## 2024-03-06 MED ORDER — GABAPENTIN 100 MG PO CAPS: 100.0000 mg | ORAL_CAPSULE | Freq: Every day | ORAL | 2 refills | Status: DC

## 2024-03-08 ENCOUNTER — Other Ambulatory Visit: Payer: Self-pay | Admitting: Internal Medicine

## 2024-03-12 DIAGNOSIS — Z419 Encounter for procedure for purposes other than remedying health state, unspecified: Secondary | ICD-10-CM | POA: Diagnosis not present

## 2024-03-19 MED ORDER — HYOSCYAMINE SULFATE ER 0.375 MG PO TB12: 0.3750 mg | ORAL_TABLET | Freq: Two times a day (BID) | ORAL | 3 refills | Status: AC

## 2024-03-19 MED ORDER — AMPHETAMINE-DEXTROAMPHETAMINE 30 MG PO TABS: 1.0000 | ORAL_TABLET | Freq: Two times a day (BID) | ORAL | 0 refills | Status: DC

## 2024-03-19 MED ORDER — PANTOPRAZOLE SODIUM 20 MG PO TBEC: 20.0000 mg | DELAYED_RELEASE_TABLET | Freq: Two times a day (BID) | ORAL | 3 refills | Status: AC

## 2024-03-19 NOTE — Addendum Note (Signed)
 Addended by: Gavin Kast on: 03/19/2024 10:32 AM   Modules accepted: Orders

## 2024-03-26 ENCOUNTER — Other Ambulatory Visit: Payer: Self-pay | Admitting: Internal Medicine

## 2024-03-27 NOTE — Telephone Encounter (Signed)
 Last Ov 01/26/24 Filled by historical provider

## 2024-04-04 DIAGNOSIS — Z13820 Encounter for screening for osteoporosis: Secondary | ICD-10-CM | POA: Diagnosis not present

## 2024-04-10 ENCOUNTER — Ambulatory Visit: Admitting: Internal Medicine

## 2024-04-12 ENCOUNTER — Ambulatory Visit: Admitting: Internal Medicine

## 2024-04-12 DIAGNOSIS — Z419 Encounter for procedure for purposes other than remedying health state, unspecified: Secondary | ICD-10-CM | POA: Diagnosis not present

## 2024-04-12 NOTE — Progress Notes (Deleted)
 [facility] [address] [place] [address] Dept: [phone] Dept Fax: [phone]   Office Visit  Subjective:   Margaret Higgins [birth year] 04/12/2024  No chief complaint on file.   HPI:    The following portions of the patient's history were reviewed and updated as appropriate: past medical history, past surgical history, family history, social history, allergies, medications, and problem list.   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 Family History  Problem Relation Age of Onset   Heart attack Mother    Parkinson's disease Mother    CAD Mother    Alzheimer's disease Father    Diabetes Father    Hypertension Father     Current Outpatient Medications:    amphetamine -dextroamphetamine  (ADDERALL) 30 MG tablet, Take 1 tablet by mouth 2 (two) times daily., Disp: 60 tablet, Rfl: 0   BD PEN NEEDLE NANO U/F 32G X 4 MM MISC, USE AS DIRECTED ONCE DAILY WITH LEVEMIR AND 3 TIMES DAILY FOR HUMALOG, Disp: , Rfl: 4   Blood Glucose Monitoring Suppl (CONTOUR NEXT MONITOR) w/Device KIT, USE TO TEST BLOOD SUGAR BID, Disp: , Rfl: 0   buPROPion  (WELLBUTRIN  XL) 300 MG 24 hr tablet, Take 1 tablet (300 mg total) by mouth daily., Disp: 90 tablet, Rfl: 3   cholestyramine  (QUESTRAN) 4 g packet, Take 4 g by mouth daily., Disp: , Rfl:    clonazePAM  (KLONOPIN ) 1 MG tablet, Take 1 tablet (1 mg total) by mouth 2 (two) times daily as needed for anxiety., Disp: 30 tablet, Rfl: 1   colestipol (COLESTID) 1 g tablet, Take 1 g by mouth., Disp: , Rfl:    CONTOUR NEXT TEST test strip, , Disp: , Rfl: 2   diclofenac sodium (VOLTAREN) 1 % GEL, APPLY 4 GRAMS EXTERNALLY TO THE AFFECTED AREA TWICE DAILY, Disp: 100 g, Rfl: 0   estradiol (VIVELLE-DOT) 0.05 MG/24HR patch, USE UTD, Disp: , Rfl: 11   gabapentin  (NEURONTIN ) 100 MG capsule, Take 1 capsule (100 mg total) by mouth at bedtime., Disp: 30 capsule, Rfl: 2   glycopyrrolate (ROBINUL) 1 MG tablet, Take 1 mg by mouth 2 (two) times daily., Disp: , Rfl:    halobetasol (ULTRAVATE) 0.05 % ointment, , Disp: , Rfl: 0   hyoscyamine  (LEVBID) 0.375 MG 12 hr tablet, Take 1 tablet (0.375 mg total) by mouth 2 (two) times daily., Disp: 180 tablet, Rfl: 3   insulin aspart (NOVOLOG FLEXPEN) 100 UNIT/ML FlexPen, Inject 5 units three times a day with meals for blood sugar levels at or above 200., Disp: 15 mL, Rfl: 3   LEVEMIR FLEXTOUCH 100 UNIT/ML Pen, Inject 12 Units into the skin at bedtime., Disp: , Rfl: 12   meloxicam  (MOBIC ) 15 MG tablet, TAKE 1 TABLET BY MOUTH EVERY DAY AS NEEDED FOR PAIN, Disp: 30 tablet, Rfl: 2  ondansetron  (ZOFRAN -ODT) 4 MG disintegrating tablet, Take 1 tablet (4 mg total) by mouth every 8 (eight) hours as needed for nausea or vomiting., Disp: 20 tablet, Rfl: 1   pantoprazole  (PROTONIX ) 20 MG tablet, Take 1 tablet (20 mg total) by mouth 2 (two) times daily., Disp: 180 tablet, Rfl: 3   progesterone (PROMETRIUM) 100 MG capsule, Take 100 mg by mouth at bedtime., Disp: , Rfl:    Semaglutide ,0.25 or 0.5MG /DOS, (OZEMPIC , 0.25 OR 0.5 MG/DOSE,) 2 MG/3ML SOPN, INJECT 0.5 MG INTO THE SKIN ONE TIME PER WEEK, Disp: 9 mL, Rfl: 2   simvastatin (ZOCOR) 20 MG tablet, TK 1 T PO QPM, Disp: , Rfl: 0   valACYclovir (VALTREX) 1000 MG tablet, ,  Disp: , Rfl: 3   VIIBRYD  40 MG TABS, TK 1 T PO QD WITH A FULL MEAL (Patient not taking: Reported on 01/26/2024), Disp: 30 tablet, Rfl: 12 Allergies  Allergen Reactions   Codeine    Other Other (See Comments)     ROS: A complete ROS was performed with pertinent positives/negatives noted in the HPI. The remainder of the ROS are negative.    Objective:   There were no vitals filed for this visit.  GENERAL: Well-appearing, in NAD. Well nourished.  SKIN: Pink, warm and dry. No rash, lesion, ulceration, or ecchymoses.  NECK: Trachea midline. Full ROM w/o pain or tenderness. No lymphadenopathy.  RESPIRATORY: Chest wall symmetrical. Respirations even and non-labored. Breath sounds clear to auscultation bilaterally.  CARDIAC: S1, S2 present, regular rate and rhythm. Peripheral pulses 2+ bilaterally.  MSK: Muscle tone and strength appropriate for age. Joints w/o tenderness, redness, or swelling. EXTREMITIES: Without clubbing, cyanosis, or edema.  NEUROLOGIC: No motor or sensory deficits. Steady, even gait.  PSYCH/MENTAL STATUS: Alert, oriented x 3. Cooperative, appropriate mood and affect.    No results found for any visits on 04/12/24.    Assessment & Plan:   No orders of the defined types were placed in this encounter.  No orders of the defined types were placed in this encounter.  Lab Orders  No laboratory test(s) ordered today   No images are attached to the encounter or orders placed in the encounter.  No follow-ups on file.   Gavin Kast, FNP

## 2024-04-24 ENCOUNTER — Telehealth: Payer: Self-pay | Admitting: Internal Medicine

## 2024-04-24 NOTE — Telephone Encounter (Signed)
 10/06/2023 same day cancel/flat tire 04/12/2024 same day cancel  Final warning sent via mail and mychart

## 2024-05-10 ENCOUNTER — Other Ambulatory Visit (HOSPITAL_COMMUNITY): Payer: Self-pay

## 2024-05-10 ENCOUNTER — Ambulatory Visit (INDEPENDENT_AMBULATORY_CARE_PROVIDER_SITE_OTHER): Admitting: Internal Medicine

## 2024-05-10 ENCOUNTER — Telehealth: Payer: Self-pay

## 2024-05-10 ENCOUNTER — Encounter: Payer: Self-pay | Admitting: Internal Medicine

## 2024-05-10 VITALS — BP 120/82 | HR 95 | Temp 98.6°F | Ht 64.0 in | Wt 142.0 lb

## 2024-05-10 DIAGNOSIS — B009 Herpesviral infection, unspecified: Secondary | ICD-10-CM | POA: Diagnosis not present

## 2024-05-10 DIAGNOSIS — F419 Anxiety disorder, unspecified: Secondary | ICD-10-CM | POA: Diagnosis not present

## 2024-05-10 DIAGNOSIS — E785 Hyperlipidemia, unspecified: Secondary | ICD-10-CM | POA: Diagnosis not present

## 2024-05-10 DIAGNOSIS — Z794 Long term (current) use of insulin: Secondary | ICD-10-CM

## 2024-05-10 DIAGNOSIS — E1140 Type 2 diabetes mellitus with diabetic neuropathy, unspecified: Secondary | ICD-10-CM

## 2024-05-10 DIAGNOSIS — F900 Attention-deficit hyperactivity disorder, predominantly inattentive type: Secondary | ICD-10-CM | POA: Diagnosis not present

## 2024-05-10 DIAGNOSIS — F321 Major depressive disorder, single episode, moderate: Secondary | ICD-10-CM | POA: Diagnosis not present

## 2024-05-10 LAB — POCT GLYCOSYLATED HEMOGLOBIN (HGB A1C): Hemoglobin A1C: 8.2 % — AB (ref 4.0–5.6)

## 2024-05-10 MED ORDER — TIRZEPATIDE 5 MG/0.5ML ~~LOC~~ SOAJ: 5.0000 mg | SUBCUTANEOUS | 2 refills | Status: DC

## 2024-05-10 MED ORDER — TIRZEPATIDE 2.5 MG/0.5ML ~~LOC~~ SOAJ
2.5000 mg | SUBCUTANEOUS | 0 refills | Status: DC
Start: 2024-05-10 — End: 2024-05-21

## 2024-05-10 MED ORDER — VALACYCLOVIR HCL 1 G PO TABS: ORAL_TABLET | ORAL | 0 refills | Status: AC

## 2024-05-10 NOTE — Progress Notes (Signed)
 [facility] [address] [place] [address] Dept: [phone] Dept Fax: [phone]    Subjective:   Margaret Higgins 06/02/1966 05/10/2024  Chief Complaint  Patient presents with   Follow-up    HPI: Margaret Higgins presents today for re-assessment and management of chronic medical conditions.  Discussed the use of AI scribe software for clinical note transcription with the patient, who gave verbal consent to proceed.  History of Present Illness   Margaret Higgins is a 58 year old female with diabetes who presents for a follow-up on her diabetes management.  Her HbA1c has increased to 8.2%, which is higher than her usual levels. She reports that stress-related factors, including having to sell her house and move, as well as family tensions following her husband's passing, have contributed to her increased A1c. She reports that she has been stress eating.  She is currently on Levemir, taking 12 units daily, and uses her Insulin Aspart 5 units three times a day as needed. She has been on Ozempic  0.5mg  but reduced the dose to 0.25 mg due to gastrointestinal side effects, including nausea.   She is due to recheck her cholesterol today.  She is compliant with taking her simvastatin.  She is getting ready to go to the beach next week.  Reports getting outbreaks of cold sores every time she goes to the beach.  Is requesting a prescription for Valtrex .  ADHD is well-controlled with Adderall.  She has recently returned back to work and uses this to help her focus throughout the day.    Anxiety and depression are manageable at this time without Wellbutrin  or Viibryd .  She does have Klonopin  that she uses very sparingly.    Lab Results  Component Value Date   HGBA1C 8.2 (A) 05/10/2024   HGBA1C 6.3 09/27/2023   HGBA1C 5.8 04/12/2023     The following portions of the patient's history were reviewed and updated as  appropriate: past medical history, past surgical history, family history, social history, allergies, medications, and problem list.   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 No pertinent surgical history. Family History  Problem Relation Age of Onset   Heart attack Mother    Parkinson's disease Mother    CAD Mother    Alzheimer's disease Father    Diabetes Father    Hypertension Father     Current Outpatient Medications:    amphetamine -dextroamphetamine  (ADDERALL) 30 MG tablet, Take 1 tablet by mouth 2 (two) times daily., Disp: 60 tablet, Rfl: 0   BD PEN NEEDLE NANO U/F 32G X 4 MM MISC, USE AS DIRECTED ONCE DAILY WITH LEVEMIR AND 3 TIMES DAILY FOR HUMALOG, Disp: , Rfl: 4   Blood Glucose Monitoring Suppl (CONTOUR NEXT MONITOR) w/Device KIT, USE TO TEST BLOOD SUGAR BID, Disp: ,  Rfl: 0   cholestyramine (QUESTRAN) 4 g packet, Take 4 g by mouth daily., Disp: , Rfl:    clonazePAM  (KLONOPIN ) 1 MG tablet, Take 1 tablet (1 mg total) by mouth 2 (two) times daily as needed for anxiety., Disp: 30 tablet, Rfl: 1   colestipol (COLESTID) 1 g tablet, Take 1 g by mouth., Disp: , Rfl:    CONTOUR NEXT TEST test strip, , Disp: , Rfl: 2   diclofenac sodium (VOLTAREN) 1 % GEL, APPLY 4 GRAMS EXTERNALLY TO THE  AFFECTED AREA TWICE DAILY, Disp: 100 g, Rfl: 0   estradiol (VIVELLE-DOT) 0.05 MG/24HR patch, USE UTD, Disp: , Rfl: 11   glycopyrrolate (ROBINUL) 1 MG tablet, Take 1 mg by mouth 2 (two) times daily., Disp: , Rfl:    halobetasol (ULTRAVATE) 0.05 % ointment, , Disp: , Rfl: 0   hyoscyamine  (LEVBID ) 0.375 MG 12 hr tablet, Take 1 tablet (0.375 mg total) by mouth 2 (two) times daily., Disp: 180 tablet, Rfl: 3   insulin aspart (NOVOLOG FLEXPEN) 100 UNIT/ML FlexPen, Inject 5 units three times a day with meals for blood sugar levels at or above 200., Disp: 15 mL, Rfl: 3   LEVEMIR FLEXTOUCH 100 UNIT/ML Pen, Inject 12 Units into the skin at bedtime., Disp: , Rfl: 12   meloxicam  (MOBIC ) 15 MG tablet, TAKE 1 TABLET BY MOUTH EVERY DAY AS NEEDED FOR PAIN, Disp: 30 tablet, Rfl: 2   ondansetron  (ZOFRAN -ODT) 4 MG disintegrating tablet, Take 1 tablet (4 mg total) by mouth every 8 (eight) hours as needed for nausea or vomiting., Disp: 20 tablet, Rfl: 1   pantoprazole  (PROTONIX ) 20 MG tablet, Take 1 tablet (20 mg total) by mouth 2 (two) times daily., Disp: 180 tablet, Rfl: 3   progesterone (PROMETRIUM) 100 MG capsule, Take 100 mg by mouth at bedtime., Disp: , Rfl:    simvastatin (ZOCOR) 20 MG tablet, TK 1 T PO QPM, Disp: , Rfl: 0   tirzepatide  (MOUNJARO ) 2.5 MG/0.5ML Pen, Inject 2.5 mg into the skin once a week. For 4 weeks, THEN inject 5mg  once weekly., Disp: 2 mL, Rfl: 0   tirzepatide  (MOUNJARO ) 5 MG/0.5ML Pen, Inject 5 mg into the skin once a week., Disp: 2 mL, Rfl: 2   valACYclovir  (VALTREX ) 1000 MG tablet, Take 2 tablets once daily at onset of outbreak., Disp: 90 tablet, Rfl: 0 Allergies  Allergen Reactions   Codeine    Other Other (See Comments)     ROS: A complete ROS was performed with pertinent positives/negatives noted in the HPI. The remainder of the ROS are negative.    Objective:   Today's Vitals   05/10/24 1430  BP: 120/82  Pulse: 95  Temp: 98.6 F (37 C)  TempSrc: Temporal  SpO2: 99%   Weight: 142 lb (64.4 kg)  Height: 5' 4 (1.626 m)    GENERAL: Well-appearing, in NAD. Well nourished.  SKIN: Pink, warm and dry. No rash, lesion, ulceration, or ecchymoses.  NECK: Trachea midline. Full ROM w/o pain or tenderness. No lymphadenopathy.  RESPIRATORY: Chest wall symmetrical. Respirations even and non-labored. Breath sounds clear to auscultation bilaterally.  CARDIAC: S1, S2 present, regular rate and rhythm. Peripheral pulses 2+ bilaterally.  EXTREMITIES: Without clubbing, cyanosis, or edema.  NEUROLOGIC: Steady, even gait.  PSYCH/MENTAL STATUS: Alert, oriented x 3. Cooperative, appropriate mood and affect.   Health Maintenance Due  Topic Date Due   DEXA SCAN  Never done   COVID-19 Vaccine (1) Never done   Diabetic kidney evaluation - Urine ACR  Never done   Zoster Vaccines- Shingrix (1 of 2) Never done   Cervical Cancer Screening (HPV/Pap Cotest)  05/15/2022   Colonoscopy  07/25/2024    Results for orders placed or performed in visit on 05/10/24  POCT glycosylated hemoglobin (Hb A1C)  Result Value Ref Range   Hemoglobin A1C 8.2 (A) 4.0 - 5.6 %   HbA1c POC (<> result, manual entry)     HbA1c, POC (prediabetic range)     HbA1c, POC (controlled diabetic range)      The 10-year ASCVD risk score (Arnett DK, et al., 2019) is: 4.7%     Assessment & Plan:  1. Type 2 diabetes mellitus with diabetic neuropathy, with long-term current use of insulin (HCC) (Primary) - POCT glycosylated hemoglobin (Hb A1C) - Basic Metabolic Panel (BMET) - Microalbumin / creatinine urine ratio - tirzepatide  (MOUNJARO ) 2.5 MG/0.5ML Pen; Inject 2.5 mg into the skin once a week. For 4 weeks, THEN inject 5mg  once weekly.  Dispense: 2 mL; Refill: 0 - tirzepatide  (MOUNJARO ) 5 MG/0.5ML Pen; Inject 5 mg into the skin once a week.  Dispense: 2 mL; Refill: 2 - Discontinue Ozempic  -Continue Levemir 12 units once daily -Continue insulin aspart 5 units 3 times daily before meals  2. Hyperlipidemia,  unspecified hyperlipidemia type -Continue statin therapy - Lipid panel  3. Anxiety -Manageable at this time, use Klonopin  as needed  4. Depression, major, single episode, moderate (HCC) -Manageable at this time  5. Attention deficit hyperactivity disorder, predominantly inattentive type -Continue Adderall as prescribed  6. HSV (herpes simplex virus) infection - valACYclovir  (VALTREX ) 1000 MG tablet; Take 2 tablets once daily at onset of outbreak.  Dispense: 90 tablet; Refill: 0   Orders Placed This Encounter  Procedures   Basic Metabolic Panel (BMET)   Lipid panel   Microalbumin / creatinine urine ratio   POCT glycosylated hemoglobin (Hb A1C)   No images are attached to the encounter or orders placed in the encounter. Meds ordered this encounter  Medications   tirzepatide  (MOUNJARO ) 2.5 MG/0.5ML Pen    Sig: Inject 2.5 mg into the skin once a week. For 4 weeks, THEN inject 5mg  once weekly.    Dispense:  2 mL    Refill:  0    Supervising Provider:   THOMPSON, AARON B [[id number]]   tirzepatide  (MOUNJARO ) 5 MG/0.5ML Pen    Sig: Inject 5 mg into the skin once a week.    Dispense:  2 mL    Refill:  2    Supervising Provider:   THOMPSON, AARON B [[id number]]   valACYclovir  (VALTREX ) 1000 MG tablet    Sig: Take 2 tablets once daily at onset of outbreak.    Dispense:  90 tablet    Refill:  0    Supervising Provider:   SEBASTIAN BEVERLEY NOVAK [[id number]]    Return in about 4 months (around 09/10/2024) for Chronic Condition follow up.   Rosina Senters, FNP

## 2024-05-10 NOTE — Telephone Encounter (Signed)
 Pharmacy Patient Advocate Encounter   Received notification from CoverMyMeds that prior authorization for Mounjaro  5MG /0.5ML auto-injectors  is required/requested.   Insurance verification completed.   The patient is insured through [facility] [place] .   Per test claim: PA required; PA started via CoverMyMeds. KEY [id number] . Waiting for clinical questions to populate.

## 2024-05-10 NOTE — Patient Instructions (Signed)
 Cold Sores:  Take valtrex  1 tablet once daily prior to beach trip. If cold sore occurs, take 2 tablets once daily.

## 2024-05-11 LAB — MICROALBUMIN / CREATININE URINE RATIO
Creatinine,U: 55.7 mg/dL
Microalb Creat Ratio: 30.7 mg/g — ABNORMAL HIGH (ref 0.0–30.0)
Microalb, Ur: 1.7 mg/dL (ref 0.0–1.9)

## 2024-05-11 LAB — LIPID PANEL
Cholesterol: 204 mg/dL — ABNORMAL HIGH (ref 0–200)
HDL: 53.9 mg/dL (ref >39.00)
LDL Cholesterol: 99 mg/dL (ref 0–99)
NonHDL: 149.85
Total CHOL/HDL Ratio: 4
Triglycerides: 255 mg/dL — ABNORMAL HIGH (ref 0.0–149.0)
VLDL: 51 mg/dL — ABNORMAL HIGH (ref 0.0–40.0)

## 2024-05-11 LAB — BASIC METABOLIC PANEL WITH GFR
BUN: 17 mg/dL (ref 6–23)
CO2: 27 meq/L (ref 19–32)
Calcium: 9.8 mg/dL (ref 8.4–10.5)
Chloride: 97 meq/L (ref 96–112)
Creatinine, Ser: 0.82 mg/dL (ref 0.40–1.20)
GFR: 77.96 mL/min (ref >60.00)
Glucose, Bld: 345 mg/dL — ABNORMAL HIGH (ref 70–99)
Potassium: 4.2 meq/L (ref 3.5–5.1)
Sodium: 131 meq/L — ABNORMAL LOW (ref 135–145)

## 2024-05-11 NOTE — Telephone Encounter (Signed)
 Prior Authorization form/request asks a question that requires your assistance. Please see the question below and advise accordingly. The PA will not be submitted until the necessary information is received. PLEASE BE ADVISED  I do not see this anywhere in the patient's chart. IF PT HAS TRIED METFORMIN.

## 2024-05-12 ENCOUNTER — Encounter: Payer: Self-pay | Admitting: Internal Medicine

## 2024-05-12 DIAGNOSIS — Z419 Encounter for procedure for purposes other than remedying health state, unspecified: Secondary | ICD-10-CM | POA: Diagnosis not present

## 2024-05-15 ENCOUNTER — Encounter: Payer: Self-pay | Admitting: Internal Medicine

## 2024-05-15 ENCOUNTER — Telehealth: Payer: Self-pay

## 2024-05-15 ENCOUNTER — Other Ambulatory Visit (HOSPITAL_COMMUNITY): Payer: Self-pay

## 2024-05-15 NOTE — Telephone Encounter (Signed)
 Pharmacy Patient Advocate Encounter   Received notification from CoverMyMeds that prior authorization for Ozempic  2 is required/requested.   Insurance verification completed.   The patient is insured through [facility] [place] . Current PA expires 05/24/24, can submit for renewal at that time   Per test claim: The current 28 day co-pay is, $4.00.  No PA needed at this time. This test claim was processed through [facility]- copay amounts may vary at other pharmacies due to pharmacy/plan contracts, or as the patient moves through the different stages of their insurance plan.

## 2024-05-15 NOTE — Telephone Encounter (Signed)
 I don't see anything in the chart about patient taking Metformin. Ok to submit PA

## 2024-05-15 NOTE — Telephone Encounter (Signed)
Forwarding message

## 2024-05-15 NOTE — Telephone Encounter (Signed)
 Yes, patient has done trial and failed Metformin. Intolerance to metformin , caused nausea. I have added this to her allergy list.

## 2024-05-15 NOTE — Telephone Encounter (Signed)
 We are switching patient from Ozempic  to Mounjaro  as she had some intolerable side effects. Not sure why I am getting this for the Ozempic  now since it was just recently stopped.   Also, she has tried Metformin in the past and failed due to nausea/vomiting.

## 2024-05-16 NOTE — Telephone Encounter (Addendum)
 Pharmacy Patient Advocate Encounter   Received notification from CoverMyMeds that prior authorization for Mounjaro  5MG /0.5ML auto-injectors and Mounjaro  2.5MG /0.5ML auto-injectors   is required/requested.   Insurance verification completed.   The patient is insured through [facility] [place] .   Per test claim: PA required; PA submitted to above mentioned insurance via CoverMyMeds Key/confirmation #/EOC [id number] Status is pending   PLEASE BE ADVISED I HAVE RESUBMITTED PA WITH TRIAL AND FAILED [email or web address].SABRA

## 2024-05-17 ENCOUNTER — Other Ambulatory Visit: Payer: Self-pay | Admitting: Internal Medicine

## 2024-05-17 ENCOUNTER — Telehealth: Payer: Self-pay

## 2024-05-17 MED ORDER — LEVEMIR FLEXTOUCH 100 UNIT/ML ~~LOC~~ SOPN: 12.0000 [IU] | PEN_INJECTOR | Freq: Every day | SUBCUTANEOUS | 12 refills | Status: DC

## 2024-05-17 NOTE — Telephone Encounter (Signed)
**Note De-identified  [place] Obfuscation** Please advise 

## 2024-05-17 NOTE — Telephone Encounter (Signed)
 Rx sent to the pharmacy.

## 2024-05-21 ENCOUNTER — Other Ambulatory Visit (HOSPITAL_COMMUNITY): Payer: Self-pay

## 2024-05-21 ENCOUNTER — Telehealth: Payer: Self-pay

## 2024-05-21 DIAGNOSIS — Z794 Long term (current) use of insulin: Secondary | ICD-10-CM

## 2024-05-21 MED ORDER — TIRZEPATIDE 5 MG/0.5ML ~~LOC~~ SOAJ: 5.0000 mg | SUBCUTANEOUS | 2 refills | Status: AC

## 2024-05-21 MED ORDER — TIRZEPATIDE 2.5 MG/0.5ML ~~LOC~~ SOAJ: 2.5000 mg | SUBCUTANEOUS | 0 refills | Status: AC

## 2024-05-21 NOTE — Telephone Encounter (Signed)
 Pharmacy Patient Advocate Encounter   Received notification from CoverMyMeds that prior authorization for Ozempic  2 is required/requested.   Insurance verification completed.   The patient is insured through [facility] [place] .   Per test claim: I see from previous encounters patient has been switched to Mounjaro . Patient pharmacy needs to be notified.

## 2024-05-21 NOTE — Telephone Encounter (Signed)
 Rx was resubmitted to the pharmacy

## 2024-05-21 NOTE — Addendum Note (Signed)
 Addended by: DWYANE HIM D on: 05/21/2024 10:23 AM   Modules accepted: Orders

## 2024-05-23 ENCOUNTER — Other Ambulatory Visit (HOSPITAL_COMMUNITY): Payer: Self-pay

## 2024-05-23 ENCOUNTER — Encounter: Payer: Self-pay | Admitting: Internal Medicine

## 2024-05-23 DIAGNOSIS — F900 Attention-deficit hyperactivity disorder, predominantly inattentive type: Secondary | ICD-10-CM

## 2024-05-24 ENCOUNTER — Other Ambulatory Visit (HOSPITAL_COMMUNITY): Payer: Self-pay

## 2024-05-24 NOTE — Telephone Encounter (Signed)
 Notified pt of approval via mychart msg.

## 2024-05-24 NOTE — Telephone Encounter (Signed)
 Received MyChart msg from patient. Please advise on status of Auth for Monjauro.  Hello! I received a msg from [facility] saying the RX for Mounjaro  required prior authorization from insurance and I needed to contact my provider. I know you were working on this last week, and seeing as they approved Ozempic  I can't imagine this RX would not be approved! Please help however you can to expedite this request with [facility]. As always thank you for all you do! Levada

## 2024-05-24 NOTE — Telephone Encounter (Signed)
 Pharmacy Patient Advocate Encounter  Received notification from [facility] Medicaid that Prior Authorization for Mounjaro  5MG /0.5ML auto-injectors has been APPROVED from 05/16/2024 to 05/16/2025   PA #/Case ID/Reference #: [id number]  Approved. This drug has been approved. Approved quantity: 2 milliliters per 28 day(s). You may fill up to a 34 day supply at a retail pharmacy. You may fill up to a 90 day supply for maintenance drugs, please refer to the formulary for details. Please call the pharmacy to process your prescription claim.. Authorization Expiration Date: May 16, 2025.

## 2024-06-06 MED ORDER — AMPHETAMINE-DEXTROAMPHETAMINE 30 MG PO TABS: 1.0000 | ORAL_TABLET | Freq: Two times a day (BID) | ORAL | 0 refills | Status: DC

## 2024-06-12 DIAGNOSIS — Z419 Encounter for procedure for purposes other than remedying health state, unspecified: Secondary | ICD-10-CM | POA: Diagnosis not present

## 2024-06-19 ENCOUNTER — Other Ambulatory Visit: Payer: Self-pay | Admitting: Internal Medicine

## 2024-06-19 DIAGNOSIS — K529 Noninfective gastroenteritis and colitis, unspecified: Secondary | ICD-10-CM

## 2024-06-19 DIAGNOSIS — M150 Primary generalized (osteo)arthritis: Secondary | ICD-10-CM

## 2024-06-25 MED ORDER — PROGESTERONE MICRONIZED 100 MG PO CAPS
100.0000 mg | ORAL_CAPSULE | Freq: Every day | ORAL | 1 refills | Status: AC
Start: 2024-06-25 — End: ?

## 2024-06-25 NOTE — Addendum Note (Signed)
 Addended by: Hadlee Burback on: 06/25/2024 08:21 AM   Modules accepted: Orders

## 2024-07-13 DIAGNOSIS — Z419 Encounter for procedure for purposes other than remedying health state, unspecified: Secondary | ICD-10-CM | POA: Diagnosis not present

## 2024-08-12 DIAGNOSIS — Z419 Encounter for procedure for purposes other than remedying health state, unspecified: Secondary | ICD-10-CM | POA: Diagnosis not present

## 2024-09-04 ENCOUNTER — Encounter: Payer: Self-pay | Admitting: Internal Medicine

## 2024-09-04 ENCOUNTER — Ambulatory Visit: Admitting: Internal Medicine

## 2024-09-04 VITALS — BP 122/82 | HR 72 | Temp 98.0°F | Ht 63.0 in | Wt 137.4 lb

## 2024-09-04 DIAGNOSIS — M898X1 Other specified disorders of bone, shoulder: Secondary | ICD-10-CM

## 2024-09-04 DIAGNOSIS — M542 Cervicalgia: Secondary | ICD-10-CM
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
## 2024-09-12 DIAGNOSIS — Z419 Encounter for procedure for purposes other than remedying health state, unspecified: Secondary | ICD-10-CM | POA: Diagnosis not present

## 2024-09-18 ENCOUNTER — Ambulatory Visit: Admitting: Internal Medicine

## 2024-09-19 ENCOUNTER — Ambulatory Visit

## 2024-09-19 ENCOUNTER — Other Ambulatory Visit

## 2024-09-19 DIAGNOSIS — M542 Cervicalgia: Secondary | ICD-10-CM

## 2024-09-19 DIAGNOSIS — M898X1 Other specified disorders of bone, shoulder: Secondary | ICD-10-CM

## 2024-09-19 DIAGNOSIS — M4802 Spinal stenosis, cervical region: Secondary | ICD-10-CM | POA: Diagnosis not present

## 2024-09-19 DIAGNOSIS — M47812 Spondylosis without myelopathy or radiculopathy, cervical region: Secondary | ICD-10-CM | POA: Diagnosis not present

## 2024-10-01 ENCOUNTER — Other Ambulatory Visit: Payer: Self-pay | Admitting: Internal Medicine

## 2024-10-01 DIAGNOSIS — M150 Primary generalized (osteo)arthritis: Secondary | ICD-10-CM

## 2024-10-03 ENCOUNTER — Ambulatory Visit: Payer: Self-pay | Admitting: Family

## 2024-10-03 NOTE — Telephone Encounter (Signed)
 Refill request received for QNC:Wnwz LOV:09/04/2024 Last refill:06/19/2024 Medication is pending your approval.

## 2024-10-04 ENCOUNTER — Other Ambulatory Visit: Payer: Self-pay | Admitting: Family

## 2024-10-04 DIAGNOSIS — M150 Primary generalized (osteo)arthritis: Secondary | ICD-10-CM

## 2024-10-04 MED ORDER — MELOXICAM 15 MG PO TABS: 15.0000 mg | ORAL_TABLET | Freq: Every day | ORAL | 2 refills | Status: AC

## 2024-10-12 DIAGNOSIS — Z419 Encounter for procedure for purposes other than remedying health state, unspecified: Secondary | ICD-10-CM | POA: Diagnosis not present

## 2024-10-17 ENCOUNTER — Encounter: Payer: Self-pay | Admitting: Internal Medicine

## 2024-10-17 DIAGNOSIS — F900 Attention-deficit hyperactivity disorder, predominantly inattentive type: Secondary | ICD-10-CM

## 2024-10-17 DIAGNOSIS — M150 Primary generalized (osteo)arthritis: Secondary | ICD-10-CM

## 2024-10-17 MED ORDER — DICLOFENAC SODIUM 1 % EX GEL: 2.0000 g | Freq: Two times a day (BID) | CUTANEOUS | 1 refills | Status: AC | PRN

## 2024-10-17 MED ORDER — AMPHETAMINE-DEXTROAMPHETAMINE 30 MG PO TABS: 1.0000 | ORAL_TABLET | Freq: Two times a day (BID) | ORAL | 0 refills | Status: AC

## 2024-10-17 NOTE — Telephone Encounter (Signed)
 Pt requesting refill for amphetamine -dextroamphetamine  (ADDERALL) 30 MG tablet   LOV 09/04/24 FOV  not scheduled LRF  06/06/24  Pt requesting refill for diclofenac  sodium (VOLTAREN ) 1 % GEL    LOV 09/04/24 FOV not scheduled  LRF 01-17-2018

## 2024-11-15 ENCOUNTER — Other Ambulatory Visit: Payer: Self-pay | Admitting: Internal Medicine
